# Patient Record
Sex: Male | Born: 1945 | Race: White | Hispanic: No | Marital: Single | Smoking: Former smoker
Health system: Southern US, Community
[De-identification: ages and names within clinical notes are randomized; demographics above are authoritative.]

## PROBLEM LIST (undated history)

## (undated) DIAGNOSIS — I2510 Atherosclerotic heart disease of native coronary artery without angina pectoris: Secondary | ICD-10-CM

## (undated) DIAGNOSIS — I10 Essential (primary) hypertension: Secondary | ICD-10-CM

## (undated) DIAGNOSIS — I219 Acute myocardial infarction, unspecified: Secondary | ICD-10-CM

## (undated) DIAGNOSIS — E785 Hyperlipidemia, unspecified: Secondary | ICD-10-CM

## (undated) DIAGNOSIS — I509 Heart failure, unspecified: Secondary | ICD-10-CM

## (undated) DIAGNOSIS — I639 Cerebral infarction, unspecified: Secondary | ICD-10-CM

## (undated) HISTORY — PX: CORONARY ARTERY BYPASS GRAFT: SHX141

## (undated) HISTORY — PX: THUMB FUSION: SUR636

## (undated) HISTORY — PX: CARDIAC CATHETERIZATION: SHX172

---

## 2003-12-03 ENCOUNTER — Other Ambulatory Visit: Payer: Self-pay

## 2003-12-05 ENCOUNTER — Other Ambulatory Visit: Payer: Self-pay

## 2004-11-13 ENCOUNTER — Emergency Department: Payer: Self-pay | Admitting: Emergency Medicine

## 2005-02-14 ENCOUNTER — Inpatient Hospital Stay: Payer: Self-pay

## 2005-02-14 ENCOUNTER — Other Ambulatory Visit: Payer: Self-pay

## 2005-06-25 ENCOUNTER — Inpatient Hospital Stay: Payer: Self-pay | Admitting: Internal Medicine

## 2005-06-25 ENCOUNTER — Other Ambulatory Visit: Payer: Self-pay

## 2005-12-04 ENCOUNTER — Inpatient Hospital Stay: Payer: Self-pay | Admitting: Internal Medicine

## 2005-12-04 ENCOUNTER — Other Ambulatory Visit: Payer: Self-pay

## 2005-12-05 ENCOUNTER — Other Ambulatory Visit: Payer: Self-pay

## 2005-12-06 ENCOUNTER — Other Ambulatory Visit: Payer: Self-pay

## 2010-12-05 ENCOUNTER — Inpatient Hospital Stay: Payer: Self-pay | Admitting: Internal Medicine

## 2012-02-18 ENCOUNTER — Emergency Department: Payer: Self-pay | Admitting: Emergency Medicine

## 2012-02-24 ENCOUNTER — Emergency Department: Payer: Self-pay | Admitting: Emergency Medicine

## 2014-01-04 ENCOUNTER — Inpatient Hospital Stay: Payer: Self-pay | Admitting: Internal Medicine

## 2014-01-04 LAB — CBC
HCT: 39.8 % — ABNORMAL LOW (ref 40.0–52.0)
HGB: 13.5 g/dL (ref 13.0–18.0)
MCH: 28.7 pg (ref 26.0–34.0)
MCHC: 33.9 g/dL (ref 32.0–36.0)
MCV: 85 fL (ref 80–100)
PLATELETS: 149 10*3/uL — AB (ref 150–440)
RBC: 4.7 10*6/uL (ref 4.40–5.90)
RDW: 14 % (ref 11.5–14.5)
WBC: 6.8 10*3/uL (ref 3.8–10.6)

## 2014-01-04 LAB — BASIC METABOLIC PANEL
Anion Gap: 7 (ref 7–16)
BUN: 17 mg/dL (ref 7–18)
CALCIUM: 9.1 mg/dL (ref 8.5–10.1)
CHLORIDE: 111 mmol/L — AB (ref 98–107)
CREATININE: 1.09 mg/dL (ref 0.60–1.30)
Co2: 23 mmol/L (ref 21–32)
EGFR (African American): >60
EGFR (Non-African Amer.): >60
GLUCOSE: 154 mg/dL — AB (ref 65–99)
Osmolality: 286 (ref 275–301)
Potassium: 4 mmol/L (ref 3.5–5.1)
Sodium: 141 mmol/L (ref 136–145)

## 2014-01-04 LAB — PRO B NATRIURETIC PEPTIDE: B-TYPE NATIURETIC PEPTID: 6898 pg/mL — AB (ref 0–125)

## 2014-01-04 LAB — PROTIME-INR
INR: 1.1
Prothrombin Time: 14.5 secs (ref 11.5–14.7)

## 2014-01-04 LAB — CK TOTAL AND CKMB (NOT AT ARMC)
CK, Total: 67 U/L
CK-MB: 1.3 ng/mL (ref 0.5–3.6)

## 2014-01-04 LAB — HEMOGLOBIN A1C: HEMOGLOBIN A1C: 6 % (ref 4.2–6.3)

## 2014-01-04 LAB — APTT: Activated PTT: 28.8 secs (ref 23.6–35.9)

## 2014-01-04 LAB — TROPONIN I: Troponin-I: 0.17 ng/mL — ABNORMAL HIGH

## 2014-01-05 LAB — BASIC METABOLIC PANEL
Anion Gap: 6 — ABNORMAL LOW (ref 7–16)
BUN: 18 mg/dL (ref 7–18)
CALCIUM: 9.1 mg/dL (ref 8.5–10.1)
Chloride: 109 mmol/L — ABNORMAL HIGH (ref 98–107)
Co2: 27 mmol/L (ref 21–32)
Creatinine: 1.28 mg/dL (ref 0.60–1.30)
EGFR (African American): >60
EGFR (Non-African Amer.): 58 — ABNORMAL LOW
Glucose: 108 mg/dL — ABNORMAL HIGH (ref 65–99)
Osmolality: 286 (ref 275–301)
Potassium: 4.5 mmol/L (ref 3.5–5.1)
SODIUM: 142 mmol/L (ref 136–145)

## 2014-01-05 LAB — HEMOGLOBIN A1C: Hemoglobin A1C: 6.1 % (ref 4.2–6.3)

## 2014-01-05 LAB — LIPID PANEL
Cholesterol: 177 mg/dL (ref 0–200)
HDL: 29 mg/dL — AB (ref 40–60)
Ldl Cholesterol, Calc: 129 mg/dL — ABNORMAL HIGH (ref 0–100)
Triglycerides: 93 mg/dL (ref 0–200)
VLDL CHOLESTEROL, CALC: 19 mg/dL (ref 5–40)

## 2014-01-05 LAB — TSH: Thyroid Stimulating Horm: 3.29 u[IU]/mL

## 2014-01-05 LAB — MAGNESIUM: Magnesium: 2.1 mg/dL

## 2014-01-05 LAB — TROPONIN I
TROPONIN-I: 0.2 ng/mL — AB
Troponin-I: 0.16 ng/mL — ABNORMAL HIGH

## 2014-01-06 LAB — BASIC METABOLIC PANEL
ANION GAP: 4 — AB (ref 7–16)
BUN: 26 mg/dL — ABNORMAL HIGH (ref 7–18)
Calcium, Total: 9.1 mg/dL (ref 8.5–10.1)
Chloride: 104 mmol/L (ref 98–107)
Co2: 30 mmol/L (ref 21–32)
Creatinine: 1.5 mg/dL — ABNORMAL HIGH (ref 0.60–1.30)
EGFR (African American): 55 — ABNORMAL LOW
EGFR (Non-African Amer.): 47 — ABNORMAL LOW
GLUCOSE: 99 mg/dL (ref 65–99)
OSMOLALITY: 280 (ref 275–301)
Potassium: 4.6 mmol/L (ref 3.5–5.1)
Sodium: 138 mmol/L (ref 136–145)

## 2014-01-06 LAB — MAGNESIUM: Magnesium: 2.1 mg/dL

## 2014-01-07 LAB — BASIC METABOLIC PANEL
ANION GAP: 4 — AB (ref 7–16)
BUN: 28 mg/dL — ABNORMAL HIGH (ref 7–18)
Calcium, Total: 9.1 mg/dL (ref 8.5–10.1)
Chloride: 104 mmol/L (ref 98–107)
Co2: 28 mmol/L (ref 21–32)
Creatinine: 1.26 mg/dL (ref 0.60–1.30)
EGFR (African American): >60
EGFR (Non-African Amer.): 59 — ABNORMAL LOW
GLUCOSE: 107 mg/dL — AB (ref 65–99)
Osmolality: 278 (ref 275–301)
Potassium: 4.7 mmol/L (ref 3.5–5.1)
SODIUM: 136 mmol/L (ref 136–145)

## 2014-01-22 LAB — CBC
HCT: 37 % — AB (ref 40.0–52.0)
HGB: 12.1 g/dL — ABNORMAL LOW (ref 13.0–18.0)
MCH: 28.1 pg (ref 26.0–34.0)
MCHC: 32.6 g/dL (ref 32.0–36.0)
MCV: 86 fL (ref 80–100)
Platelet: 158 10*3/uL (ref 150–440)
RBC: 4.3 10*6/uL — ABNORMAL LOW (ref 4.40–5.90)
RDW: 13.9 % (ref 11.5–14.5)
WBC: 6.3 10*3/uL (ref 3.8–10.6)

## 2014-01-22 LAB — COMPREHENSIVE METABOLIC PANEL
ALBUMIN: 3.4 g/dL (ref 3.4–5.0)
ALK PHOS: 132 U/L — AB
ALT: 20 U/L (ref 12–78)
ANION GAP: 6 — AB (ref 7–16)
BILIRUBIN TOTAL: 0.6 mg/dL (ref 0.2–1.0)
BUN: 16 mg/dL (ref 7–18)
Calcium, Total: 8.6 mg/dL (ref 8.5–10.1)
Chloride: 110 mmol/L — ABNORMAL HIGH (ref 98–107)
Co2: 27 mmol/L (ref 21–32)
Creatinine: 1.26 mg/dL (ref 0.60–1.30)
EGFR (African American): >60
EGFR (Non-African Amer.): 59 — ABNORMAL LOW
GLUCOSE: 175 mg/dL — AB (ref 65–99)
OSMOLALITY: 290 (ref 275–301)
Potassium: 3.9 mmol/L (ref 3.5–5.1)
SGOT(AST): 25 U/L (ref 15–37)
Sodium: 143 mmol/L (ref 136–145)
Total Protein: 7.3 g/dL (ref 6.4–8.2)

## 2014-01-22 LAB — URINALYSIS, COMPLETE
BILIRUBIN, UR: NEGATIVE
Bacteria: NONE SEEN
Blood: NEGATIVE
Glucose,UR: NEGATIVE mg/dL (ref 0–75)
Ketone: NEGATIVE
Nitrite: NEGATIVE
PH: 5 (ref 4.5–8.0)
Protein: 30
RBC,UR: NONE SEEN /HPF (ref 0–5)
SPECIFIC GRAVITY: 1.028 (ref 1.003–1.030)
Squamous Epithelial: NONE SEEN

## 2014-01-22 LAB — PRO B NATRIURETIC PEPTIDE: B-TYPE NATIURETIC PEPTID: 5488 pg/mL — AB (ref 0–125)

## 2014-01-22 LAB — CK TOTAL AND CKMB (NOT AT ARMC)
CK, Total: 50 U/L
CK-MB: 1.3 ng/mL (ref 0.5–3.6)

## 2014-01-22 LAB — TROPONIN I: TROPONIN-I: 0.05 ng/mL

## 2014-01-23 ENCOUNTER — Inpatient Hospital Stay: Payer: Self-pay | Admitting: Specialist

## 2014-01-23 DIAGNOSIS — R079 Chest pain, unspecified: Secondary | ICD-10-CM

## 2014-01-23 LAB — CBC WITH DIFFERENTIAL/PLATELET
BASOS ABS: 0 10*3/uL (ref 0.0–0.1)
Basophil %: 0.5 %
EOS ABS: 0.1 10*3/uL (ref 0.0–0.7)
EOS PCT: 1.2 %
HCT: 39 % — ABNORMAL LOW (ref 40.0–52.0)
HGB: 12.5 g/dL — ABNORMAL LOW (ref 13.0–18.0)
Lymphocyte #: 1 10*3/uL (ref 1.0–3.6)
Lymphocyte %: 14 %
MCH: 27.7 pg (ref 26.0–34.0)
MCHC: 32.1 g/dL (ref 32.0–36.0)
MCV: 86 fL (ref 80–100)
MONO ABS: 0.6 x10 3/mm (ref 0.2–1.0)
MONOS PCT: 8.2 %
Neutrophil #: 5.4 10*3/uL (ref 1.4–6.5)
Neutrophil %: 76.1 %
PLATELETS: 171 10*3/uL (ref 150–440)
RBC: 4.53 10*6/uL (ref 4.40–5.90)
RDW: 14.1 % (ref 11.5–14.5)
WBC: 7 10*3/uL (ref 3.8–10.6)

## 2014-01-23 LAB — LIPID PANEL
CHOLESTEROL: 125 mg/dL (ref 0–200)
HDL: 31 mg/dL — AB (ref 40–60)
LDL CHOLESTEROL, CALC: 80 mg/dL (ref 0–100)
TRIGLYCERIDES: 72 mg/dL (ref 0–200)
VLDL Cholesterol, Calc: 14 mg/dL (ref 5–40)

## 2014-01-23 LAB — APTT
ACTIVATED PTT: 66.2 s — AB (ref 23.6–35.9)
Activated PTT: 33.9 secs (ref 23.6–35.9)

## 2014-01-23 LAB — TROPONIN I
TROPONIN-I: 1.2 ng/mL — AB
Troponin-I: 3.6 ng/mL — ABNORMAL HIGH

## 2014-01-23 LAB — CK-MB
CK-MB: 10.8 ng/mL — AB (ref 0.5–3.6)
CK-MB: 5.7 ng/mL — AB (ref 0.5–3.6)
CK-MB: 9.1 ng/mL — AB (ref 0.5–3.6)

## 2014-01-24 LAB — BASIC METABOLIC PANEL
ANION GAP: 4 — AB (ref 7–16)
BUN: 13 mg/dL (ref 7–18)
Calcium, Total: 9.1 mg/dL (ref 8.5–10.1)
Chloride: 102 mmol/L (ref 98–107)
Co2: 33 mmol/L — ABNORMAL HIGH (ref 21–32)
Creatinine: 1.14 mg/dL (ref 0.60–1.30)
EGFR (Non-African Amer.): >60
GLUCOSE: 106 mg/dL — AB (ref 65–99)
Osmolality: 278 (ref 275–301)
Potassium: 3.6 mmol/L (ref 3.5–5.1)
Sodium: 139 mmol/L (ref 136–145)

## 2014-01-24 LAB — APTT: ACTIVATED PTT: 74.3 s — AB (ref 23.6–35.9)

## 2014-01-28 LAB — CULTURE, BLOOD (SINGLE)

## 2014-04-11 ENCOUNTER — Inpatient Hospital Stay: Payer: Self-pay | Admitting: Internal Medicine

## 2014-04-11 LAB — BASIC METABOLIC PANEL
ANION GAP: 6 — AB (ref 7–16)
BUN: 21 mg/dL — ABNORMAL HIGH (ref 7–18)
Calcium, Total: 8.5 mg/dL (ref 8.5–10.1)
Chloride: 109 mmol/L — ABNORMAL HIGH (ref 98–107)
Co2: 29 mmol/L (ref 21–32)
Creatinine: 1.17 mg/dL (ref 0.60–1.30)
EGFR (African American): >60
GLUCOSE: 137 mg/dL — AB (ref 65–99)
Osmolality: 292 (ref 275–301)
Potassium: 4.3 mmol/L (ref 3.5–5.1)
SODIUM: 144 mmol/L (ref 136–145)

## 2014-04-11 LAB — CBC
HCT: 39.6 % — AB (ref 40.0–52.0)
HGB: 12.7 g/dL — AB (ref 13.0–18.0)
MCH: 27.7 pg (ref 26.0–34.0)
MCHC: 32.1 g/dL (ref 32.0–36.0)
MCV: 86 fL (ref 80–100)
PLATELETS: 147 10*3/uL — AB (ref 150–440)
RBC: 4.58 10*6/uL (ref 4.40–5.90)
RDW: 15.7 % — ABNORMAL HIGH (ref 11.5–14.5)
WBC: 7.1 10*3/uL (ref 3.8–10.6)

## 2014-04-11 LAB — CK-MB
CK-MB: 1.2 ng/mL (ref 0.5–3.6)
CK-MB: 1.2 ng/mL (ref 0.5–3.6)
CK-MB: 1.1 ng/mL (ref 0.5–3.6)

## 2014-04-11 LAB — TROPONIN I
TROPONIN-I: 0.08 ng/mL — AB
Troponin-I: 0.08 ng/mL — ABNORMAL HIGH
Troponin-I: 0.08 ng/mL — ABNORMAL HIGH

## 2014-04-11 LAB — PRO B NATRIURETIC PEPTIDE: B-TYPE NATIURETIC PEPTID: 11464 pg/mL — AB (ref 0–125)

## 2014-04-11 LAB — MAGNESIUM: Magnesium: 2.1 mg/dL

## 2014-04-12 LAB — COMPREHENSIVE METABOLIC PANEL
ALK PHOS: 122 U/L — AB
ALT: 14 U/L
Albumin: 3.4 g/dL (ref 3.4–5.0)
Anion Gap: 10 (ref 7–16)
BILIRUBIN TOTAL: 1.7 mg/dL — AB (ref 0.2–1.0)
BUN: 21 mg/dL — ABNORMAL HIGH (ref 7–18)
CO2: 29 mmol/L (ref 21–32)
CREATININE: 1.16 mg/dL (ref 0.60–1.30)
Calcium, Total: 8.6 mg/dL (ref 8.5–10.1)
Chloride: 102 mmol/L (ref 98–107)
EGFR (Non-African Amer.): >60
Glucose: 124 mg/dL — ABNORMAL HIGH (ref 65–99)
OSMOLALITY: 286 (ref 275–301)
POTASSIUM: 3.9 mmol/L (ref 3.5–5.1)
SGOT(AST): 25 U/L (ref 15–37)
Sodium: 141 mmol/L (ref 136–145)
Total Protein: 7.7 g/dL (ref 6.4–8.2)

## 2014-04-12 LAB — CBC WITH DIFFERENTIAL/PLATELET
Basophil #: 0 x10 3/mm 3
Basophil %: 0.4 %
Eosinophil #: 0.1 x10 3/mm 3
Eosinophil %: 1.4 %
HCT: 40 %
HGB: 13 g/dL
Lymphocyte %: 19.1 %
Lymphs Abs: 1.5 x10 3/mm 3
MCH: 27.8 pg
MCHC: 32.4 g/dL
MCV: 86 fL
Monocyte #: 0.7 "x10 3/mm "
Monocyte %: 9.3 %
Neutrophil #: 5.5 x10 3/mm 3
Neutrophil %: 69.8 %
Platelet: 143 x10 3/mm 3 — ABNORMAL LOW
RBC: 4.67 x10 6/mm 3
RDW: 15.2 % — ABNORMAL HIGH
WBC: 7.9 x10 3/mm 3

## 2014-04-12 LAB — PROTIME-INR
INR: 1.3
Prothrombin Time: 15.6 secs — ABNORMAL HIGH (ref 11.5–14.7)

## 2014-12-22 NOTE — Discharge Summary (Signed)
Dates of Admission and Diagnosis:  Date of Admission 11-Apr-2014   Date of Discharge 13-Apr-2014   Admitting Diagnosis CHF   Final Diagnosis Ac systolic CHF Htn Elevated troponin- demnad ischemia due to CHF.    Chief Complaint/History of Present Illness HISTORY OF PRESENT ILLNESS: This is a 69 year old male with a known history of congestive heart failure, systolic ejection fraction 25%, hypertension, coronary artery disease status post CABG. The patient presents with complaints of worsening shortness of breath and worsening lower extremity edema and scrotal edema. The patient reports his symptoms have been progressively worsening over the last few weeks. The patient was recently admitted in May of this year for the same complaints, which improved after diuresis. The patient reports he has been compliant with his medication. As well, he tries to be compliant with his diet. He adds no salt to his food whenever he cooks, but when he is not, which is most of the time, he eats pre-made food. As well, he reports worsening lower extremity edema and having some scrotal edema as well worsening shortness of breath, mainly orthopnea. He sleeps on 3 pillows at baseline, reports this has been for a few months. He reports he is spending most of the time sitting, as well. As well, he reports dyspnea on exertion. Denies any chest pain or chest discomfort.   The patient's EKG does not have any significant changes from previous, where it does show right bundle branch block and left posterior fascicular block. The patient's chest x-ray does show evidence of volume overload. The patient's troponin was found to be elevated at 0.08, but by reviewing records, the patient appears to be having chronically elevated troponin. At one point, during his most recent admission, it was up to 3.6 which was deemed secondary to demand ischemia. As well in the ED, the patient was noticed to have a few runs of VTach, where he had 11 runs  of sustained VTach. The patient denied any complaints during that run.   Allergies:  No Known Allergies:   Hepatic:  13-Aug-15 04:52   Bilirubin, Total  1.7  Alkaline Phosphatase  122 (46-116 NOTE: New Reference Range 03/20/14)  SGPT (ALT) 14 (14-63 NOTE: New Reference Range 03/20/14)  SGOT (AST) 25  Total Protein, Serum 7.7  Albumin, Serum 3.4  Routine Chem:  12-Aug-15 04:26   Glucose, Serum  137  BUN  21  Creatinine (comp) 1.17  Sodium, Serum 144  Potassium, Serum 4.3  Chloride, Serum  109  CO2, Serum 29  Calcium (Total), Serum 8.5  Osmolality (calc) 292  eGFR (African American) >60  eGFR (Non-African American) >60 (eGFR values <22m/min/1.73 m2 may be an indication of chronic kidney disease (CKD). Calculated eGFR is useful in patients with stable renal function. The eGFR calculation will not be reliable in acutely ill patients when serum creatinine is changing rapidly. It is not useful in  patients on dialysis. The eGFR calculation may not be applicable to patients at the low and high extremes of body sizes, pregnant women, and vegetarians.)  Anion Gap  6  Result Comment TROPONIN - RESULTS VERIFIED BY REPEAT TESTING.  - CALLED TO ALICIA RAWLINS AT 028368/12/15  - BY [facility].  - READ-BACK PROCESS PERFORMED.  Result(s) reported on 11 Apr 2014 at 05:23AM.  Magnesium, Serum 2.1 (1.8-2.4 THERAPEUTIC RANGE: 4-7 mg/dL TOXIC: > 10 mg/dL  -----------------------)  B-Type Natriuretic Peptide ([facility]  [id number](Result(s) reported on 11 Apr 2014 at 05:18AM.)  13-Aug-15 04:52   Glucose, Serum  124  BUN  21  Creatinine (comp) 1.16  Sodium, Serum 141  Potassium, Serum 3.9  Chloride, Serum 102  CO2, Serum 29  Calcium (Total), Serum 8.6  Osmolality (calc) 286  eGFR (African American) >60  eGFR (Non-African American) >60 (eGFR values <9m/min/1.73 m2 may be an indication of chronic kidney disease (CKD). Calculated eGFR is useful in patients with stable renal function. The eGFR  calculation will not be reliable in acutely ill patients when serum creatinine is changing rapidly. It is not useful in  patients on dialysis. The eGFR calculation may not be applicable to patients at the low and high extremes of body sizes, pregnant women, and vegetarians.)  Anion Gap 10  Cardiac:  12-Aug-15 04:26   CPK-MB, Serum 1.2 (Result(s) reported on 11 Apr 2014 at 0[facility])  Troponin I  0.08 (0.00-0.05 0.05 ng/mL or less: NEGATIVE  Repeat testing in 3-6 hrs  if clinically indicated. >0.05 ng/mL: POTENTIAL  MYOCARDIAL INJURY. Repeat  testing in 3-6 hrs if  clinically indicated. NOTE: An increase or decrease  of 30% or more on serial  testing suggests a  clinically important change)    08:39   Troponin I  0.08 (0.00-0.05 0.05 ng/mL or less: NEGATIVE  Repeat testing in 3-6 hrs  if clinically indicated. >0.05 ng/mL: POTENTIAL  MYOCARDIAL INJURY. Repeat  testing in 3-6 hrs if  clinically indicated. NOTE: An increase or decrease  of 30% or more on serial  testing suggests a  clinically important change)    12:40   Troponin I  0.08 (0.00-0.05 0.05 ng/mL or less: NEGATIVE  Repeat testing in 3-6 hrs  if clinically indicated. >0.05 ng/mL: POTENTIAL  MYOCARDIAL INJURY. Repeat  testing in 3-6 hrs if  clinically indicated. NOTE: An increase or decrease  of 30% or more on serial  testing suggests a  clinically important change)  Routine Coag:  13-Aug-15 04:52   Prothrombin  15.6  INR 1.3 (INRreference interval applies to patients on anticoagulant therapy. A single INR therapeutic range for coumarins is not optimal for all indications; however, the suggested range for most indications is 2.0 - 3.0. Exceptions to the INR Reference Range may include: Prosthetic heart valves, acute myocardial infarction, prevention of myocardial infarction, and combinations of aspirin and anticoagulant. The need for a higher or lower target INR must be assessed individually. Reference:  The Pharmacology and Management of the Vitamin K  antagonists: the seventh ACCP Conference on Antithrombotic and Thrombolytic Therapy. CFXJOI.3254Sept:126 (3suppl): [id number] A HCT value >55% may artifactually increase the PT.  In one study,  the increase was an average of 25%. Reference:  "Effect on Routine and Special Coagulation Testing Values of Citrate Anticoagulant Adjustment in Patients with High HCT Values." American Journal of Clinical Pathology 2006;126:400-405.)  Routine Hem:  12-Aug-15 04:26   WBC (CBC) 7.1  RBC (CBC) 4.58  Hemoglobin (CBC)  12.7  Hematocrit (CBC)  39.6  Platelet Count (CBC)  147 (Result(s) reported on 11 Apr 2014 at 04:59AM.)  MCV 86  MCH 27.7  MCHC 32.1  RDW  15.7  13-Aug-15 04:52   WBC (CBC) 7.9  RBC (CBC) 4.67  Hemoglobin (CBC) 13.0  Hematocrit (CBC) 40.0  Platelet Count (CBC)  143  MCV 86  MCH 27.8  MCHC 32.4  RDW  15.2  Neutrophil % 69.8  Lymphocyte % 19.1  Monocyte % 9.3  Eosinophil % 1.4  Basophil % 0.4  Neutrophil # 5.5  Lymphocyte # 1.5  Monocyte # 0.7  Eosinophil #  0.1  Basophil # 0.0 (Result(s) reported on 12 Apr 2014 at 05:45AM.)   PERTINENT RADIOLOGY STUDIES: XRay:    12-Aug-15 05:20, Chest PA and Lateral  Chest PA and Lateral   REASON FOR EXAM:    SOB  COMMENTS:       PROCEDURE: DXR - DXR CHEST PA (OR AP) AND LATERAL  - Apr 11 2014  5:20AM     CLINICAL DATA:  Shortness of breath, CHF.    EXAM:  CHEST  2 VIEW    COMPARISON:  Chest radiograph Jan 22, 2014    FINDINGS:  The cardiac silhouette appears moderate to severely enlarged,  unchanged. Status post median sternotomy for apparent Coronary  artery bypass grafting. Similar central pulmonary vascular  congestion. Retrocardiac consolidation with possible small left  pleural effusion. Strandy densities in left lung base. No  pneumothorax.    Soft tissue planes and included osseous structures are  nonsuspicious.     IMPRESSION:  Stable cardiomegaly, pulmonary  vascular congestion with retrocardiac  consolidation which could reflect atelectasis and/or confluent  edema. Possible small left pleural effusion.      Electronically Signed    By: Elon Alas    On: 04/11/2014 05:51         Verified By: Ricky Ala, M.D.,  LabUnknown:  PACS Image    Pertinent Past History:  Pertinent Past History Hypertension, coronary artery disease status post CABG, congestive heart failure systolic.   Hospital Course:  Hospital Course 1.  Systolic congestive heart failure.    IV lasix BID, appreciated Cardiology consult.   beta blockers, Imdur, add Aldactone, ejection fraction less than 30%.     much better today.    ambulating fine now- d/c home- follow in [facility].  2.  Elevated troponins.  no chest pain, and has no EKG changes. likely demand ischemia from his congestive heart failure.   3.  Hypertension. Blood pressure is mildly elevated.   continue him on beta blockers, lisinopril and Imdur, and added hydralazine and Aldactone. increased dose of metoprolol.  4.  A few runs of nonsustained ventricular tachycardia.    magnesium, 2 mg of IV magnesium sulfate. on telemetry monitor.   Condition on Discharge Stable   Code Status:  Code Status Full Code   DISCHARGE INSTRUCTIONS HOME MEDS:  Medication Reconciliation: Patient's Home Medications at Discharge:     Medication Instructions  nitroglycerin 0.4 mg sublingual tablet  1 tab(s) sublingual , As needed, chest pain   spironolactone 25 mg oral tablet  1 tab(s) orally once a day   lisinopril 40 mg oral tablet  1 tab(s) orally once a day   isosorbide mononitrate 60 mg oral tablet, extended release  1 tab(s) orally once a day   pravastatin 40 mg oral tablet  1 tab(s) orally once a day (at bedtime)   metoprolol tartrate 25 mg oral tablet  1 tab(s) orally 2 times a day   furosemide 20 mg oral tablet  1 tab(s) orally 2 times a day     Physician's Instructions:  Diet Low Sodium   Low Fat, Low Cholesterol   Activity Limitations As tolerated   Return to Work Not Applicable   Time frame for Follow Up Appointment 1-2 weeks  Dr.Kowalski   Time frame for Follow Up Appointment [facility] Friday, April 27, 2014 at 9:00am. Please call [phone] to reschedule.     Serafina Royals J(Consultant): [facility], [address] [place], [place] [address], [place]  Electronic Signatures: Vaughan Basta (MD)  (Signed [phone] 21:05)  Authored: ADMISSION DATE AND DIAGNOSIS, CHIEF COMPLAINT/HPI, Allergies, PERTINENT LABS, PERTINENT RADIOLOGY STUDIES, PERTINENT PAST HISTORY, HOSPITAL COURSE, DISCHARGE INSTRUCTIONS HOME MEDS, PATIENT INSTRUCTIONS, Follow Up Physician   Last Updated: 18-Aug-15 21:05 by Vaughan Basta (MD)

## 2014-12-22 NOTE — H&P (Signed)
PATIENT NAME:  Dillon [name], ELMAN MR#:  [id number] DATE OF BIRTH:  09/05/1945  DATE OF ADMISSION:  04/11/2014  REFERRING PHYSICIAN:  Loney Hering, MD  PRIMARY CARE PHYSICIAN: Recently started seeing Scot Jun. Jenne Campus, PA-C.  CARDIOLOGIST: Corey Skains, MD  CHIEF COMPLAINT: Shortness of breath. Worsening lower extremity edema.   HISTORY OF PRESENT ILLNESS: This is a 69 year old male with a known history of congestive heart failure, systolic ejection fraction 25%, hypertension, coronary artery disease status post CABG. The patient presents with complaints of worsening shortness of breath and worsening lower extremity edema and scrotal edema. The patient reports his symptoms have been progressively worsening over the last few weeks. The patient was recently admitted in May of this year for the same complaints, which improved after diuresis. The patient reports he has been compliant with his medication. As well, he tries to be compliant with his diet. He adds no salt to his food whenever he cooks, but when he is not, which is most of the time, he eats pre-made food. As well, he reports worsening lower extremity edema and having some scrotal edema as well worsening shortness of breath, mainly orthopnea. He sleeps on 3 pillows at baseline, reports this has been for a few months. He reports he is spending most of the time sitting, as well. As well, he reports dyspnea on exertion. Denies any chest pain or chest discomfort.   The patient's EKG does not have any significant changes from previous, where it does show right bundle branch block and left posterior fascicular block. The patient's chest x-ray does show evidence of volume overload. The patient's troponin was found to be elevated at 0.08, but by reviewing records, the patient appears to be having chronically elevated troponin. At one point, during his most recent admission, it was up to 3.6 which was deemed secondary to demand ischemia. As well in  the ED, the patient was noticed to have a few runs of VTach, where he had 11 runs of sustained VTach. The patient denied any complaints during that run.   REVIEW OF SYSTEMS:  CONSTITUTIONAL: Denies fever, chills. Reports fatigue, weakness. Reports weight gain.  EYES: Denies blurry vision, double vision or inflammation.  EARS, NOSE, THROAT: Denies tinnitus, ear pain, hearing loss, or epistaxis.  RESPIRATORY: Denies cough, wheezing. Reports dyspnea on exertion and orthopnea. Denies COPD.  CARDIOVASCULAR: Denies any chest pain. Reports orthopnea and worsening edema. No syncope. No palpitations.  GASTROINTESTINAL: Denies nausea, vomiting, diarrhea, abdominal pain, or hematemesis.  GENITOURINARY: Denies dysuria, hematuria, or renal colic.  ENDOCRINE: Denies polyuria, polydipsia, heat or cold intolerance.  HEMATOLOGY: Denies anemia, easy bruising, bleeding, diathesis. INTEGUMENT: Denies acne, rash, or skin lesion.  MUSCULOSKELETAL: Denies any arthritis, neck pain, back pain, cramps.  NEUROLOGIC: Denies any history of CVA, TIA, focal deficits, tingling, numbness, headache. Reports some lightheadedness.  PSYCHIATRIC: Denies anxiety, insomnia or depression.   PAST MEDICAL HISTORY: Hypertension, coronary artery disease status post CABG, congestive heart failure systolic.   PAST SOCIAL HISTORY: Tobacco use, alcohol use in the past. No history of drug use.   FAMILY HISTORY: Significant for: Hypertension.   ALLERGIES: No known drug allergies.   HOME MEDICATIONS:  1.  Lisinopril 40 mg oral daily.  2.  Imdur 60 mg oral daily.  3.  Sublingual nitroglycerin as needed.  4.  Pravastatin 40 mg oral at bedtime.  5.  Metoprolol succinate 25 mg extended release oral daily.  6.  Lasix 20 mg oral daily.    PHYSICAL  EXAMINATION:  VITAL SIGNS:  Temperature 97.9, pulse 73, respiratory rate 22, blood pressure 178/91, saturating 99% on oxygen.  GENERAL: Morbidly obese male, lays comfortably in bed, in no  apparent distress.  HEENT: Head atraumatic, normocephalic. Pupils equal and reactive to light. Pink conjunctivae. Anicteric sclerae. Moist oral mucosa. No nasal discharge or bleed.  NECK: Supple. No thyromegaly. Has thickening neck; could not appreciate any JVD, but has hepatojugular reflux. No carotid bruits.  CHEST: Had good air entry bilaterally. No wheezing, rales, rhonchi, or any use of accessory muscles.  CARDIOVASCULAR: S1, S2 heard. No rubs, murmur, or gallops. PMI displaced laterally.  ABDOMEN: Obese, soft, nontender, nondistended. Bowel sounds present.  EXTREMITIES: There is +3 edema bilaterally, up to the scrotum. Pedal pulses felt bilaterally.  GENITOURINARY: He has scrotal edema.  PSYCHIATRIC: Appropriate affect. Awake, alert x 3. Intact judgment and insight.  NEUROLOGIC: Cranial nerves II-XII grossly intact. Motor 5/5. No focal deficits.  MUSCULOSKELETAL: No joint effusion or erythema.  LYMPHATICS: No cervical lymphadenopathy could be appreciated.   PERTINENT LABORATORIES: Glucose 137. BNP 11,464. BUN 21, creatinine 1.17, sodium 144, potassium 4.3, chloride 109, CO2 of 29. Troponin 0.08. White blood cells 7.1, hemoglobin 12.7, hematocrit 39.6, platelets 147,000.   Chest x-ray showing stable cardiomegaly, pulmonary vascular congestion with retrocardiac consolidation which could reflect atelectasis and/or confluent edema; possible small left pleural effusion.   EKG showing normal sinus rhythm at 66 beats per minute, with bifascicular block which is old.   ASSESSMENT AND PLAN:  1.  Systolic congestive heart failure. The patient presents with acute exacerbation, evident by his worsening edema and shortness of breath and orthopnea. He will be started on IV Lasix and kept on fluid restrictions with strict ins and outs, daily weights. Will consult cardiology service. The patient's primary cardiologist is Dr. Nehemiah Massed. As well, we will continue him on his beta blockers, Imdur. Will add  Aldactone given the fact he is known to have ejection fraction less than 30%.  2.  Elevated troponins. The patient denies any chest pain, and has no EKG changes. This is most likely demand ischemia from his congestive heart failure. Actually, the levels are trending down. We will give him 1 dose of aspirin. We will continue to cycle his cardiac enzymes and follow the trend.  3.  Hypertension. Blood pressure is mildly elevated. We will continue him on beta blockers and lisinopril and Imdur, and will add hydralazine and Aldactone.  4.  A few runs of nonsustained ventricular tachycardia. We will give the patient magnesium, 2 mg of IV magnesium sulfate. We will check magnesium level. We will keep on telemetry monitor. We will consult cardiology.  5.  Deep vein thrombosis prophylaxis. Subcutaneous heparin.   CODE STATUS: The patient is Full Code.   TOTAL TIME SPENT ON ADMISSION AND PATIENT CARE: 55 minutes.     ____________________________ Albertine Patricia, MD dse:MT D: 04/11/2014 06:46:07 ET T: 04/11/2014 07:08:59 ET JOB#: [id number]  cc: Albertine Patricia, MD, <Dictator> Kelena Garrow Graciela Husbands MD ELECTRONICALLY SIGNED 04/13/2014 13:16

## 2014-12-22 NOTE — H&P (Signed)
PATIENT NAME:  Dillon [name], Dillon [name] MR#:  [id number] DATE OF BIRTH:  1945/11/18  DATE OF ADMISSION:  01/04/2014  PRIMARY CARE PHYSICIAN: None.   CHIEF COMPLAINT: Chest pain, shortness of breath and lower extremity edema.   HISTORY OF PRESENT ILLNESS:  A 69 year old male with a history of CAD, CABG and  stents who is noncompliant any medications except for aspirin, who presents with the above complaint. Over the past week the patient has had shortness of breath, chest pain, PND, orthopnea, and increasing lower extremity edema. He presented today because he was worried.  In the ER, he was noted to have an elevated BNP and chest x-ray consistent with pulmonary edema as well as elevation in his troponin.   REVIEW OF SYSTEMS: CONSTITUTIONAL: No fever, no chills. Positive fatigue, weakness, positive weight gain.  EYES: No blurred or double vision ENT: No ear pain or hearing loss. Seasonal allergies. Positive snoring . No cough, wheezing, hemoptysis, COPD or dyspnea.  CARDIOVASCULAR: Positive chest pain. Positive orthopnea. Positive PND. Positive  GASTROINTESTINAL: Positive nausea. No vomiting, diarrhea, abdominal pain, melena or ulcers. GENITOURINARY:  No dysuria or hematuria. ENDOCRINE:  Positive polyuria. No polydipsia.  HEME AND LYMPH: Positive easy bruising. SKIN: No rash or lesions. MUSCULOSKELETAL: Positive limited activity due to body habitus. PSYCH: No history of anxiety or depression.   PAST MEDICAL HISTORY: 1. CAD status post CABG and stents.  2. Hypertension.  3. Noncompliance.   MEDICATIONS: He only takes aspirin 81 mg daily.   ALLERGIES: No known drug allergies.   PAST SURGICAL HISTORY: CABG 4 vessel.   FAMILY HISTORY: Positive for hypertension.   SOCIAL HISTORY: No tobacco, alcohol or drug use.   PHYSICAL EXAMINATION: VITAL SIGNS: Temperature 97.9, pulse 92, respirations 22, blood pressure 199/108, 93% on room air.  GENERAL: The patient is alert, oriented, sitting up, is  unable to lay flat, in some mild distress.  HEENT: Head is atraumatic. Pupils are round and reactive. Sclerae anicteric. Mucous membranes are moist. Oropharynx is clear.  NECK: Supple without JVD, carotid bruit, or enlarged thyroid.  CARDIOVASCULAR: Regular rate and rhythm. There is a 2/6 systolic ejection murmur heard best at the right sternal border without radiation. PMI is hard to palpate due to body habitus.  ABDOMEN: Obese, bowel sounds are positive, nontender. Hard to appreciate organomegaly due to body habitus. No rebound or guarding.  LUNGS: He has crackles at the bases with no wheezing, rhonchi, or rales. No dullness to percussion.  EXTREMITIES: He has got 3+ pitting edema up to his  mid thigh.  SKIN : Without rash or lesions.  NEUROLOGIC: Cranial nerves II through XII are grossly intact. No focal deficits. Strength: 5/5 strength in upper and lower extremities.  LABORATORY DATA:  White blood cells 6.8, hemoglobin 13.5, hematocrit 40, platelets 149,000. Sodium 141, potassium 4.0, chloride 111, bicarbonate 23, BUN 17, creatinine 1.09, glucose 154, calcium 9.1. INR 1.1. BNP [id number]. CK 67. CPK-MB 1.3, troponin 0.17.   IMAGING DATA: Chest x-ray is consistent with pulmonary edema.   EKG: Consistent with by bifascicular block. No ST elevation or depression.   ASSESSMENT AND PLAN: This is a 69 year old  male who is noncompliant with medications with a history of coronary artery disease  and coronary artery bypass graft who presents with new onset congestive heart failure.  1. New-onset congestive heart failure due to noncompliance with medications and probable accelerated blood pressure. The patient will be admitted to telemetry. We will need to diurese with Lasix. I have added beta  blocker and ACE inhibitor. Order an echocardiogram. Cardiology consultation and will also check a TSH to be complete for congestive heart failure. Will also monitor I's and o's and daily weights.  2. Non-ST segment  elevation myocardial infarction. The patient's troponin is elevated. This is likely due to his congestive heart failure, but he has underlying coronary artery disease and stents and not taking any medications. His last stents were about 2-1/2 years ago. He was on Plavix. I restarted the Plavix, metoprolol, a statin medication, and ACE inhibitor. Cardiology consultation. The patient on full dose Lovenox, which I reviewed side effects, alternatives, benefits and risks only including bleeding and the patient says that he does want to start full dose Lovenox to protect his heart. We will monitor his hemoglobin closely as the patient will be on Plavix and Lovenox. Continue to cycle his cardiac enzymes. Order an echocardiogram.  3. 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 I have placed the patient on nitroglycerin, metoprolol 50 b.i.d., lisinopril 40 mg daily and I have written for p.r.n. hydralazine order. We will see if this helps his blood pressure. May also have to add to p.o. hydralazine to get his blood pressure better controlled.  4. Noncompliance. I have stressed to the patient in a lengthy conversation that he needs to remain on medications as he has stents, coronary artery bypass graft and coronary artery disease. The patient says many times to me that he gets sick from all the medications.  5. Polyuria. The patient is complaining of polyuria on review of systems. We will order an A1c as his glucose level is elevated.  6. The patient is DO NOT RESUSCITATE status. The patient will need a PCP, case management and physical therapy prior to discharge.   Critical care Time Spent:  55 minutes.    ____________________________ Donell Beers. Benjie Karvonen, MD spm:dw D: 01/04/2014 19:16:57 ET T: 01/04/2014 20:32:49 ET JOB#: [id number]  cc: Alsie Younes P. Benjie Karvonen, MD, <Dictator> Donell Beers Nickie Warwick MD ELECTRONICALLY SIGNED 01/05/2014 14:30

## 2014-12-22 NOTE — Consult Note (Signed)
PATIENT NAME:  Dillon [name], CLABO MR#:  [id number] DATE OF BIRTH:  April 07, 1946  DATE OF CONSULTATION:  01/05/2014  REFERRING PHYSICIAN:  Dr. Benjie Karvonen CONSULTING PHYSICIAN:  Corey Skains, MD  REASON FOR CONSULTATION: Coronary artery disease, hypertension, and hyperlipidemia with elevated troponin, supraventricular tachycardia cardia, and acute on chronic systolic dysfunction congestive heart failure.   CHIEF COMPLAINT: "I am short of breath."   HISTORY OF PRESENT ILLNESS: This is a 69 year old male with known coronary artery disease status post coronary artery bypass graft and previous myocardial infarction who has had difficulty with taking medications due to the fact that he has had significant side effects of these medications. After this discontinuation, he has had some acute on chronic systolic dysfunction, congestive heart failure type symptoms with lower extremity edema, severe shortness of breath and weakness. This is associated with significant weight gain. He now has lower extremity edema and pulmonary edema with hypoxia and elevated troponin of 0.2 most consistent with demand ischemia. The patient also has had an elevated BNP of 689. During admission the patient had normal sinus rhythm with right bundle branch block by EKG, but had a short run of supraventricular tachycardia with asymptomatic by telemetry. The patient has had no further significant symptoms since. Now he is mildly short of breath.   REVIEW OF SYSTEMS: The remainder review of systems is negative for vision change, ringing in the ears, hearing loss, cough, congestion, heartburn, nausea, vomiting, diarrhea, bloody stools, stomach pain, extremity pain, leg weakness, cramping of the buttocks, known blood clots, headaches, blackouts, dizzy spells, nosebleeds, congestion, trouble swallowing, frequent urination, urination at night, muscle weakness, numbness, anxiety, depression, skin lesions or skin rashes.   PAST MEDICAL HISTORY: 1.   Coronary artery disease.  2.  Hypertension.  3.  Hyperlipidemia.  4.  Supraventricular tachycardia.   FAMILY HISTORY: No family members with early onset of cardiovascular disease or hypertension.   SOCIAL HISTORY: He currently denies alcohol or tobacco use.   ALLERGIES: As listed.   MEDICATIONS: As listed.   PHYSICAL EXAMINATION: VITAL SIGNS: Blood pressure is 136/68 bilaterally and heart rate is 72 upright, reclining, and regular.  GENERAL: He is a well appearing male in no acute distress.  HEENT: No icterus, thyromegaly, ulcers, hemorrhage, or xanthelasma.  CARDIOVASCULAR: Regular rate and rhythm. Normal S1 and S2. Distant heart sounds. No apparent murmur, gallop, or rub. PMI is diffuse. Carotid upstroke normal without bruit. Jugular venous pressure is normal.  LUNGS: Decreased breath sounds with crackles in the bases.  ABDOMEN: Soft, nontender. Cannot assess hepatosplenomegaly or masses due to increased abdominal girth.  EXTREMITIES: Show 2+ radial and femoral and trace dorsal pedal pulses with a 2+ lower extremity edema. No cyanosis, clubbing, or ulcers.  NEUROLOGIC: The patient is oriented to time, place, and person with normal mood and affect.   ASSESSMENT: A 69 year old male with acute on chronic systolic dysfunction congestive heart failure status post coronary artery bypass graft, hypertension, and hyperlipidemia with supraventricular tachycardia and demand ischemia.   RECOMMENDATIONS: 1.  Continue intravenous Lasix for edema, pulmonary edema and heart failure.  2.  Continue serial ECG and enzymes to assess for possible myocardial infarction.  3.  Reinstatement of possible beta blocker, ACE inhibitor for cardiomyopathy.  4.  Echocardiogram for extent of cardiomyopathy.  5.  Beta blocker for supraventricular tachycardia. 6.  Aspirin for further risk reduction in cardiovascular event.  6.  Further diagnostic testing and treatment options after above.    ____________________________ Corey Skains, MD bjk:sb  D: 01/05/2014 09:32:40 ET T: 01/05/2014 09:48:03 ET JOB#: [id number]  cc: Corey Skains, MD, <Dictator> Corey Skains MD ELECTRONICALLY SIGNED 01/07/2014 8:01

## 2014-12-22 NOTE — Consult Note (Signed)
PATIENT NAME:  Dillon [name], Dillon [name] MR#:  [id number] DATE OF BIRTH:  1946/01/22  DATE OF CONSULTATION:  04/11/2014.  REFERRING PHYSICIAN:  Dr. Jannifer Franklin.  CONSULTING PHYSICIAN:  Corey Skains, MD  REASON FOR CONSULTATION: Acute on chronic systolic dysfunction congestive heart failure.   CHIEF COMPLAINT: "I'm short of breath."   HISTORY OF PRESENT ILLNESS:  This is a 69 year old male with known coronary artery disease, status post previous coronary artery bypass graft, who has had hypertension and hyperlipidemia on  of medication management with known severe systolic dysfunction with ejection fraction of 25%. He has had appropriate medication management but over the last several weeks has had significant progression of shortness of breath, PND, orthopnea, weakness, fatigue and lower extremity edema. On arrival his BNP was 11,464 and troponin was 0.08, most consistent with acute on chronic systolic dysfunction congestive heart failure rather than acute coronary syndrome.   EKG shows normal sinus rhythm, left axis deviations with right bundle branch block, not changed since before. He did have some short runs of wide-complex tachycardia most consistent with hypoxia rather than worsening cardiovascular disease. The patient does have intravenous Lasix and now has had some improvement in his fluid amounts. He has had interview today for further evaluation of significance of diet and it does appear that his diet is reasonable with low sodium to a reasonable degree.   REVIEW OF SYSTEMS: Negative for vision change, ringing in the ears, hearing loss, cough, congestion, heartburn, nausea, vomiting, diarrhea, bloody stools, stomach pain, extremity pain, leg weakness, cramping of the buttocks, known blood clots, headaches, blackouts, dizzy spells, nosebleeds, frequent urination, urination at night, muscle weakness, numbness, anxiety, depression, skin lesions or skin rashes.   PAST MEDICAL HISTORY:  1.  Congestive  heart failure.  2.  Hypertension.  3.  Coronary disease.  4.  Supraventricular tachycardia.   FAMILY HISTORY: There are a few family members, father and mother, with early onset of cardiovascular disease and hypertension.   SOCIAL HISTORY: Currently denies alcohol and tobacco use.   ALLERGIES TO MEDICATIONS: AS LISTED.   PHYSICAL EXAMINATION:  VITAL SIGNS: Blood pressure 144/68 bilaterally. Heart rate is 70, upright, reclining, and regular.  GENERAL: He is a well-appearing male in no acute distress.  HEENT: No icterus, thyromegaly, ulcers, hemorrhage, or xanthelasma.  CARDIOVASCULAR: Regular rate and rhythm. Normal S1 and S2, a 2/6 apical murmur consistent with mild mitral regurgitation with laterally displaced PMI. Jugular venous pressure is elevated.  LUNGS: Have bibasilar crackles with decreased breath sounds.  ABDOMEN: Soft and nontender. Cannot assess hepatosplenomegaly or masses due to increased abdominal girth.  EXTREMITIES: 2+ radial, trace femoral and dorsal pedal pulses, with 1-2+ lower extremity edema. No cyanosis, clubbing or ulcers.  NEUROLOGIC: He is oriented to time, place, and person, with normal mood and affect.   ASSESSMENT: A 69 year old male with acute on chronic systolic dysfunction congestive heart failure, hypertension, hyperlipidemia, coronary artery disease with elevated BNP and minimal elevation of troponin, most consistent with demand ischemia rather than acute coronary syndrome.   RECOMMENDATIONS:  1.  Continue intravenous Lasix for further reduction of pulmonary edema, lower extremity edema and congestive heart failure.  2.  Serial EKG and enzymes to assess for possible myocardial infarction.  3.  Continue beta blocker, ACE inhibitor, isosorbide for treatment of cardiovascular risk.  4.  Continue dietary intervention as able to help with further improvements long term. Of concern is that the patient has had difficulty with higher sodium and watching for chronic  kidney  disease.  5.  Further consideration of echocardiogram for LV dysfunction and further evaluation thereof as necessary.    ____________________________ Corey Skains, MD bjk:lt D: 04/11/2014 08:18:08 ET T: 04/11/2014 10:01:09 ET JOB#: [id number]  cc: Corey Skains, MD, <Dictator> Corey Skains MD ELECTRONICALLY SIGNED 04/12/2014 12:47

## 2014-12-22 NOTE — Discharge Summary (Signed)
PATIENT NAME:  Dillon [name], Dillon [name] MR#:  [id number] DATE OF BIRTH:  [birth year]  DATE OF ADMISSION:  01/04/2014 DATE OF DISCHARGE:  01/07/2014  PRIMARY CARE PHYSICIAN: Hopefully will be at the [facility].   CARDIOLOGIST: Dr. Nehemiah Massed.   FINAL DIAGNOSES:  1. Acute systolic congestive heart failure with cardiomyopathy with moderate mitral regurgitation.  2. Accelerated hypertension.  3. Elevated troponin which is demand ischemia.  4, Benign prostatic hypertrophy.   MEDICATIONS ON DISCHARGE: Include lisinopril 40 mg daily, Flomax 0.4 mg at bedtime, nitroglycerin 0.4 mg sublingually as needed for chest pain, Imdur 60 mg extended release daily, pravastatin 40 mg at bedtime, metoprolol extended release 25 mg at bedtime, Plavix 75 mg daily, hydralazine 50 mg twice a day, furosemide 20 mg daily.   DIET: Low-sodium diet, regular consistency.   ACTIVITY: As tolerated.   FOLLOWUP: Cardiology, Dr. Nehemiah Massed, this week. Two to 4 weeks with medical doctor and Dr. Alveria Apley office.   HOSPITAL COURSE: The patient was admitted 01/04/2014 and discharged 01/07/2014. The patient came in with chest pain, shortness of breath and lower extremity edema. He was admitted with new onset congestive heart failure, elevated troponin and malignant hypertension.   LABORATORY AND RADIOLOGICAL DATA DURING THE HOSPITAL COURSE: Included an EKG that showed sinus rhythm, PVCs, right bundle branch block, left posterior fascicular block, left atrial enlargement. Hemoglobin A1c 6.0. INR 1.1. BNP [id number]. Glucose 154, BUN 7, creatinine 1.09, sodium 141, potassium 4.0, chloride 111, CO2 23. White blood cell count 6.8, H and H 13.5 and 39.8, platelet count of 148. Chest x-ray showed findings consistent with congestive heart failure. Echocardiogram showed an EF of 25% to 30%, moderately dilated right and left atrium, moderate mitral valve regurgitation. Ultrasound of the lower extremities: No evidence of DVT. Troponin went up to 0.2. TSH  3.29. LDL 129, HDL 29, triglycerides 93. Creatinine went as high as 1.5. Lasix was held then. Creatinine came down to 1.26 on discharge.   HOSPITAL COURSE PER PROBLEM LIST:  1. For the patient's acute systolic congestive heart failure with cardiomyopathy and moderate mitral regurgitation, the patient was diuresed initially with 40 mg IV Lasix q.8 hours, decreased to 20 mg IV q.12 hours, held for a day and then restarted at 20 mg daily as outpatient. The patient was started on low-dose metoprolol, limited secondary to bradycardia at 25 mg. He was also started on hydralazine, Imdur and lisinopril. The patient's lungs were clear upon discharge. The patient also had anasarca and swelling of the legs which had improved. He can wear stockings as outpatient.  2. Accelerated hypertension: Blood pressure now controlled upon discharge with all of the medication changes, 143/74.  3. For his elevated troponin, likely demand ischemia from congestive heart failure, follow up with cardiology as outpatient.  4. BPH: Started on Flomax 0.4 mg at bedtime.  5. Chronic kidney disease stage III: GFR 59 upon discharge. Close follow up as outpatient needed with new medications started and low-dose Lasix. Check a BMP with followup appointment.   TIME SPENT ON DISCHARGE: 35 minutes.    ____________________________ Tana Conch. Leslye Peer, MD rjw:gb D: 01/07/2014 14:16:14 ET T: 01/08/2014 01:27:43 ET JOB#: [id number]  cc: Tana Conch. Leslye Peer, MD, <Dictator> Corey Skains, MD Marisue Brooklyn MD ELECTRONICALLY SIGNED 01/17/2014 14:02

## 2014-12-22 NOTE — H&P (Signed)
PATIENT NAME:  Dillon [name], Dillon [name] MR#:  [id number] DATE OF BIRTH:  27-Mar-1946  DATE OF ADMISSION:  01/22/2014  REFERRING PHYSICIAN: Dr. Francene Castle.   PRIMARY CARE PHYSICIAN: None.   CARDIOLOGIST: Dr. Nehemiah Massed   CHIEF COMPLAINT: Scrotal swelling.   HISTORY OF PRESENT ILLNESS: This is a 69 year old Caucasian gentleman with history of systolic congestive heart failure, ejection fraction of 25%, hypertension, coronary artery disease status post coronary artery bypass graft, presenting with scrotal swelling. few days duration of lower extremity swelling, which has been gradually worsening as well as associated scrotal swelling. Denies any dysuria or difficulty with urination. He, however, also notes having dyspnea on exertion and orthopnea which has been progressively worsening for the same duration of time. He states compliance with medication as well as dietary restrictions. With the above symptoms, he was somewhat concerned; however, he also had chest pain about two hours prior to arrival to the Emergency Department, which finally prompted his visit. The chest pain is retrosternal in location, nonradiating, dull in quality, 7 to 8 out of 10 in intensity. No worsening or relieving factors. No further associated symptoms. Currently without complaints other than edema as described above.    REVIEW OF SYSTEMS:  CONSTITUTIONAL: Denies fever, fatigue, weakness.  EYES: Denies blurred vision, double vision, eye pain.  HEENT: Denies tinnitus, ear pain or hearing loss.  RESPIRATORY: Denies cough or wheeze. Positive for dyspnea on exertion.  CARDIOVASCULAR: Positive for chest pain, orthopnea, and edema as described above.  GASTROINTESTINAL: Denies nausea, vomiting, diarrhea, abdominal pain.  GENITOURINARY: Denies dysuria, hematuria.  ENDOCRINE: Denies nocturia or thyroid problems.  HEMATOLOGIC AND LYMPHATIC: Denies easy bruising, bleeding.  SKIN: Denies rash or lesion.  MUSCULOSKELETAL: Denies pain in  neck, back, shoulder, knees, hips or arthritic symptoms.  NEUROLOGIC: Denies paralysis, paresthesias.  PSYCHIATRIC: Denies anxiety or depression.  Otherwise, full review of systems performed, which is negative.   PAST MEDICAL HISTORY: Hypertension, coronary artery disease, status post CABG.   SOCIAL HISTORY: Remote tobacco and alcohol usage. Denies any drug usage.   FAMILY HISTORY: Positive for hypertension.   ALLERGIES: No known drug allergies.   HOME MEDICATIONS: Include lisinopril 40 mg p.o. daily, Flomax 0.4 mg p.o. at bedtime, Imdur 60 mg p.o. daily, nitroglycerin 0.4 mg sublingual p.r.n. as needed for chest pain, pravastatin 40 mg p.o. at bedtime, Plavix 75 mg p.o. daily, metoprolol 25 mg extended release p.o. daily, Lasix 20 mg p.o. daily, hydralazine 50 mg p.o. b.i.d.   PHYSICAL EXAMINATION: VITAL SIGNS: Temperature 97.9, heart rate 85, respirations 20, blood pressure 172/89, saturating 95% on room air.   GENERAL: Well-nourished, well-developed, Caucasian gentleman, currently in no acute distress.  HEAD: Normocephalic, atraumatic.  EYES: Pupils equal round, and reactive to light. Extraocular muscles intact. No scleral icterus.  MOUTH: Moist mucous membranes.  Dentition intact. No abscess noted.  EARS, NOSE, AND THROAT: Clear, without exudates. No external lesions.  NECK: Supple. No thyromegaly. No nodules. No JVD.  PULMONARY: Clear to auscultation bilaterally, without wheezes, rales, or rhonchi. No accessory muscle use.  Good respiratory effort. CHEST: Nontender to palpation.  CARDIOVASCULAR: S1 and S2. Regular rate and rhythm. No murmurs, rubs or gallops. Has 2+ edema to the thighs bilaterally. Pedal pulses 2+ bilaterally.  GENITOURINARY: Scrotal edema noted. Nontender to palpation. GASTROINTESTINAL: Soft, nontender, nondistended. No masses. Positive bowel sounds. No hepatosplenomegaly.  MUSCULOSKELETAL: No swelling, clubbing. Positive for edema as described above. Range of motion  full in all extremities.  NEUROLOGIC: Cranial nerves II through XII intact. No  gross focal neurologic deficits. Sensation intact, reflexes intact. SKIN: No ulcerations, lesions, rash, or cyanosis. Skin warm, dry. Turgor intact. Multiple tattoos over arms, legs, and penis.  PSYCHIATRIC: Mood and affect within normal limits. The patient is awake, alert, oriented x3. Insight and judgment intact.   LABORATORY DATA: EKG revealing right bundle branch block. Sodium 143, potassium 3.9, chloride 110, bicarbonate 27, BUN 16, creatinine 1.26, glucose 175, BNP 5488. LFTs: Alkaline phosphatase of 132, otherwise within normal limits. Troponin I less than 0.05. WBC 6.3, hemoglobin 12.1, platelets of 158. Chest x-ray performed: Persistent atelectasis, most prominent in the left lower lobe, as well as pulmonary edema noted, however, somewhat improved from prior films.   ASSESSMENT AND PLAN: A 69 year old gentleman with history of systolic congestive heart failure, ejection fraction 25%, presenting with scrotal swelling.  1.  Systolic congestive heart failure exacerbation. Provide diuresis with IV Lasix, follow ins and outs, daily weights, and urine output. Consult cardiology, Dr. Nehemiah Massed, as he is well-known to him. Continue with Imdur, hydralazine, and beta blockers.  2.  Chest pain. Admit to telemetry under observational status. Trend cardiac enzymes x3. Continue with aspirin and statin therapy, as well as beta blockade.  3.  Hypertension. Continue with beta blockers, lisinopril and other home medications.  4.  Deep venous thrombosis prophylaxis with heparin subcutaneous.   CODE STATUS: The patient is full code.  TIME SPENT: 45 minutes.   ____________________________ Aaron Mose. Karma Hiney, MD dkh:cg D: 01/22/2014 22:59:36 ET T: 01/22/2014 23:14:57 ET JOB#: [id number]  cc: Aaron Mose. Jazlynne Milliner, MD, <Dictator> Maham Quintin Woodfin Ganja MD ELECTRONICALLY SIGNED 01/24/2014 3:26

## 2014-12-22 NOTE — Discharge Summary (Signed)
PATIENT NAME:  Dillon [name], Dillon [name] MR#:  [id number] DATE OF BIRTH:  Dec 22, 1945  DATE OF ADMISSION:  01/23/2014 DATE OF DISCHARGE:  01/24/2014  For a detailed note, please take a look at the history and physical done on admission by Dr. Valentino Nose.   DIAGNOSES AT DISCHARGE: As follows: Acute on chronic congestive heart failure, likely systolic dysfunction. Elevated troponin, likely secondary to demand ischemia. Hypertension. Hyperlipidemia. Benign prostatic hypertrophy.   DISCHARGE DIET:  The patient is being discharged on a low-sodium, low-fat diet.   ACTIVITY: As tolerated.   FOLLOWUP:  With Dr. Serafina Royals in the next 1 to 2 weeks.   DISCHARGE MEDICATIONS:  Lisinopril 40 mg daily, Flomax 0.4 mg daily, sublingual nitroglycerin as needed, Imdur 60 mg daily, Pravachol 40 mg daily, Toprol 25 mg at bedtime, Plavix 75 mg daily, hydralazine 50 mg b.i.d., Lasix 40 mg daily and potassium 10 mEq daily.   CONSULTANTS DURING HOSPITAL COURSE: Dr. Lujean Amel from cardiology.   PERTINENT LABORATORY, DIAGNOSTIC AND RADIOLOGICAL DATA DONE DURING THE HOSPITAL COURSE: Are as follows: A chest x-ray done on admission showing improved congestive heart failure, persistent atelectasis versus infiltrate in the left lower lobe.   HOSPITAL COURSE: This is a 69 year old male who presented to the hospital with shortness of breath, scrotal edema and lower extremity edema and chest pain.  1.  Congestive heart failure. This was likely the cause of the patient's scrotal edema and also lower extremity edema. The patient was admitted to the hospital, started on IV diuresis with Lasix and maintained on his beta blockers and ACE inhibitors. The patient responded well to IV diuretics and is about 5 liters negative since admission and clinically feels much better. This was likely acute on chronic systolic dysfunction as the patient has underlying cardiomyopathy, ejection fraction of 25%. The patient at this point is being  discharged on a higher dose of Lasix along with his beta blockers and ACE inhibitors as stated. He will follow up with cardiology in the next 1 to 2 weeks.  2.  Chest pain. His chest pain was intermittent and shortly resolved after admission, although he was observed on telemetry, had 3 sets of cardiac markers which turned out to be positive as his troponins trended up as high as 3, although he was chest pain-free and hemodynamically stable. There was some concern for a non-ST-elevation myocardial infarction but the patient was seen by cardiology and they did not think that the patient needed acute cardiologic intervention and this was likely demand ischemia from his congestive heart failure. The patient currently is chest pain-free and hemodynamically stable, therefore is being discharged on his aspirin, Plavix, beta blocker and statin. He was given a heparin drip for 24 hours while in the hospital, but it was shortly thereafter discontinued.  3.  Benign prostatic hypertrophy. The patient had no urinary obstruction. He will continue his Flomax.  4.  Hypertension. The patient remained hemodynamically stable. He will continue his Toprol, Imdur,  lisinopril and hydralazine as stated.  5.  Hyperlipidemia. The patient was maintained on his Pravachol and he will also resume that upon discharge.  6.  CODE STATUS: The patient is a full code.   TIME SPENT: 40 minutes.    ____________________________ Belia Heman. Verdell Carmine, MD vjs:cs D: 01/24/2014 14:45:30 ET T: 01/24/2014 20:52:16 ET JOB#: [id number]  cc: Belia Heman. Verdell Carmine, MD, <Dictator> Corey Skains, MD Henreitta Leber MD ELECTRONICALLY SIGNED 02/03/2014 21:46

## 2014-12-22 NOTE — Consult Note (Signed)
PATIENT NAME:  Dillon [name], Dillon [name] MR#:  [id number] DATE OF BIRTH:  09-25-1945  DATE OF CONSULTATION:  01/23/2014  CONSULTING PHYSICIAN:  Zaniah Titterington D. Clayborn Bigness, MD  REFERRING PHYSICIAN: Dr. Thomasene Lot.   CARDIOLOGIST:  Dr. Nehemiah Massed.  INDICATION: Heart failure, scrotal swelling, shortness of breath.   HISTORY OF PRESENT ILLNESS: The patient is a 69 year old white male with history of systolic congestive heart failure, ejection fraction of 25%, hypertension, coronary artery disease, history of coronary artery bypass surgery, presented with scrotal swelling. He was recently in the hospital about two weeks ago.  He was treated medically for heart failure and did well, but at home, he started noticing scrotal swelling.  It got progressively worse. He finally came to the hospital for evaluation. He states to be complaint with his medications as well as dietary restriction but with the worsening swelling, he got concerned and came to the hospital. He still has some leg swelling. He had some vague atypical 7 out of 10 midsternal chest pain, but no other worsening symptoms. He was mostly concerned about mid section of scrotal swelling.   REVIEW OF SYSTEMS: No blackout spells, syncope. No nausea or vomiting. No fever. No chills. No sweats. No weight loss. No weight gain. No hemoptysis or hematemesis. Denies bright red blood per rectum. No vision change or hearing change. Denies sputum production or cough. He has had leg swelling and scrotal swelling.   PAST MEDICAL HISTORY: Hypertension, coronary artery disease.   PAST SURGICAL HISTORY: Coronary artery bypass surgery.   SOCIAL HISTORY: Smoking, remote alcohol consumption. Denies drug.   FAMILY HISTORY: Hypertension.   ALLERGIES: NONE.   MEDICATIONS: Lisinopril 40 a day, Flomax 0.4, Imdur 60, nitroglycerin 0.4, pravastatin 40 a day, Plavix 75 a day, metoprolol 25 a day, Lasix 20 a day, hydralazine 50 twice a day.   PHYSICAL EXAMINATION: VITAL SIGNS: Blood  pressure 170/80, pulse 85, respiratory rate 16, afebrile.  HEENT: Normocephalic, atraumatic. Pupils equal and reactive to light.  NECK: Supple. No significant JVD, bruits or adenopathy.  LUNGS: Clear to auscultation and percussion. No significant wheezing. No significant rhonchi. Faint rales in the bases.  HEART: Regular rate and rhythm.  S3. Soft S4. PMI displaced. Systolic ejection murmur at the apex.  ABDOMEN: Benign.  EXTREMITIES: Within normal limits.  NEUROLOGIC: Intact.  SKIN: Normal.   LABORATORY DATA:  Sodium 142, potassium 3.9, bicarbonate 27, BUN 16, creatinine 1.26, glucose 175. BNP 5488. LFTs negative. Alkaline phosphatase 132. Troponin 0.05. White count 6.3, hemoglobin 12.1, platelet count 158. Chest x-ray persistent atelectasis in the lower lobes otherwise negative.  EKG had right bundle branch block, rate of 75, nonspecific ST-T changes.   ASSESSMENT:  1.  Congestive heart failure. 2.  Cardiomyopathy.  3.  Scrotal swelling. 4.  chest pain.  5.  Hypertension.  6.  Mild obesity.  7.  Hyperlipidemia.  8.  Mild renal insufficiency.   PLAN:  1.  Agree with current therapy. Slightly elevated troponin at 0.4. Continue to rule out for myocardial infarction.  We will probably treat the patient medically. This is  probably related to demand ischemia. I do not recommend cardiac catheterization at this point. Consider doing it as an outpatient if symptoms improve. Would continue anticoagulation for now, probably heparin. 2.  Echocardiogram for assessment of LV function and wall motion.  3.  Increased diuretic therapy with Lasix to help with heart failure therapy. Continue hydralazine and Imdur as well as ACE inhibitor therapy.  4.  Renal insufficiency. Continue to follow  up renal insufficiency.  We will treat the patient medically for now. I do not recommend Nephrology consult. Continue to follow while on ACE and diuretics.  5.  Chest pain. Again, could represent acute coronary syndrome  but not likely at this point, probably represents demand ischemia. If he stays relatively asymmetric, we may consider increasing Imdur and treat the patient medically. We will defer cath for now and follow up his studies and treat the patient conservatively for now, unless symptoms persist, worsen or recur.   ____________________________ Loran Senters. Clayborn Bigness, MD ddc:dp D: 01/23/2014 12:44:17 ET T: 01/23/2014 13:26:13 ET JOB#: [id number]  cc: Robyne Matar D. Clayborn Bigness, MD, <Dictator> Yolonda Kida MD ELECTRONICALLY SIGNED 03/07/2014 16:41

## 2015-01-20 ENCOUNTER — Inpatient Hospital Stay
Admit: 2015-01-20 | Discharge: 2015-01-20 | Disposition: A | Discharge status: Home / Self Care | Payer: Medicare Other | Attending: Internal Medicine | Admitting: Internal Medicine

## 2015-01-20 ENCOUNTER — Encounter: Payer: Self-pay | Admitting: Emergency Medicine

## 2015-01-20 ENCOUNTER — Inpatient Hospital Stay
Admission: EM | Admit: 2015-01-20 | Discharge: 2015-01-23 | DRG: 292 | Disposition: A | Discharge status: Home Health | Payer: Medicare Other | Attending: Internal Medicine | Admitting: Internal Medicine

## 2015-01-20 ENCOUNTER — Emergency Department: Payer: Medicare Other

## 2015-01-20 DIAGNOSIS — I10 Essential (primary) hypertension: Secondary | ICD-10-CM | POA: Diagnosis present

## 2015-01-20 DIAGNOSIS — E785 Hyperlipidemia, unspecified: Secondary | ICD-10-CM | POA: Diagnosis present

## 2015-01-20 DIAGNOSIS — E876 Hypokalemia: Secondary | ICD-10-CM | POA: Diagnosis present

## 2015-01-20 DIAGNOSIS — I429 Cardiomyopathy, unspecified: Secondary | ICD-10-CM | POA: Diagnosis present

## 2015-01-20 DIAGNOSIS — R1909 Other intra-abdominal and pelvic swelling, mass and lump: Secondary | ICD-10-CM | POA: Diagnosis present

## 2015-01-20 DIAGNOSIS — I252 Old myocardial infarction: Secondary | ICD-10-CM | POA: Diagnosis not present

## 2015-01-20 DIAGNOSIS — R609 Edema, unspecified: Secondary | ICD-10-CM

## 2015-01-20 DIAGNOSIS — I5033 Acute on chronic diastolic (congestive) heart failure: Secondary | ICD-10-CM

## 2015-01-20 DIAGNOSIS — Z87891 Personal history of nicotine dependence: Secondary | ICD-10-CM

## 2015-01-20 DIAGNOSIS — Z951 Presence of aortocoronary bypass graft: Secondary | ICD-10-CM | POA: Diagnosis not present

## 2015-01-20 DIAGNOSIS — I2510 Atherosclerotic heart disease of native coronary artery without angina pectoris: Secondary | ICD-10-CM | POA: Diagnosis present

## 2015-01-20 DIAGNOSIS — I5023 Acute on chronic systolic (congestive) heart failure: Principal | ICD-10-CM | POA: Insufficient documentation

## 2015-01-20 DIAGNOSIS — M7989 Other specified soft tissue disorders: Secondary | ICD-10-CM | POA: Diagnosis present

## 2015-01-20 DIAGNOSIS — I255 Ischemic cardiomyopathy: Secondary | ICD-10-CM | POA: Diagnosis present

## 2015-01-20 DIAGNOSIS — K219 Gastro-esophageal reflux disease without esophagitis: Secondary | ICD-10-CM | POA: Diagnosis present

## 2015-01-20 DIAGNOSIS — Z8249 Family history of ischemic heart disease and other diseases of the circulatory system: Secondary | ICD-10-CM

## 2015-01-20 HISTORY — DX: Hyperlipidemia, unspecified: E78.5

## 2015-01-20 HISTORY — DX: Acute myocardial infarction, unspecified: I21.9

## 2015-01-20 HISTORY — DX: Atherosclerotic heart disease of native coronary artery without angina pectoris: I25.10

## 2015-01-20 HISTORY — DX: Essential (primary) hypertension: I10

## 2015-01-20 HISTORY — DX: Heart failure, unspecified: I50.9

## 2015-01-20 LAB — BASIC METABOLIC PANEL
Anion gap: 9 (ref 5–15)
BUN: 18 mg/dL (ref 6–20)
CALCIUM: 9.2 mg/dL (ref 8.9–10.3)
CO2: 28 mmol/L (ref 22–32)
Chloride: 105 mmol/L (ref 101–111)
Creatinine, Ser: 1.14 mg/dL (ref 0.61–1.24)
GFR calc non Af Amer: >60 mL/min (ref >60)
Glucose, Bld: 180 mg/dL — ABNORMAL HIGH (ref 65–99)
POTASSIUM: 3.7 mmol/L (ref 3.5–5.1)
Sodium: 142 mmol/L (ref 135–145)

## 2015-01-20 LAB — LIPID PANEL
Cholesterol: 169 mg/dL (ref 0–200)
HDL: 28 mg/dL — ABNORMAL LOW (ref >40)
LDL Cholesterol: 120 mg/dL — ABNORMAL HIGH (ref 0–99)
TRIGLYCERIDES: 104 mg/dL (ref <150)
Total CHOL/HDL Ratio: 6 RATIO
VLDL: 21 mg/dL (ref 0–40)

## 2015-01-20 LAB — CBC
HEMATOCRIT: 43.9 % (ref 40.0–52.0)
Hemoglobin: 14.5 g/dL (ref 13.0–18.0)
MCH: 28.8 pg (ref 26.0–34.0)
MCHC: 33 g/dL (ref 32.0–36.0)
MCV: 87.1 fL (ref 80.0–100.0)
Platelets: 146 10*3/uL — ABNORMAL LOW (ref 150–440)
RBC: 5.04 MIL/uL (ref 4.40–5.90)
RDW: 15.4 % — AB (ref 11.5–14.5)
WBC: 7.7 10*3/uL (ref 3.8–10.6)

## 2015-01-20 LAB — URINALYSIS COMPLETE WITH MICROSCOPIC (ARMC ONLY)
BILIRUBIN URINE: NEGATIVE
Bacteria, UA: NONE SEEN
GLUCOSE, UA: NEGATIVE mg/dL
Hgb urine dipstick: NEGATIVE
Ketones, ur: NEGATIVE mg/dL
Leukocytes, UA: NEGATIVE
NITRITE: NEGATIVE
PROTEIN: NEGATIVE mg/dL
Specific Gravity, Urine: 1.004 — ABNORMAL LOW (ref 1.005–1.030)
pH: 6 (ref 5.0–8.0)

## 2015-01-20 LAB — BRAIN NATRIURETIC PEPTIDE: B Natriuretic Peptide: 1740 pg/mL — ABNORMAL HIGH (ref 0.0–100.0)

## 2015-01-20 LAB — TROPONIN I
TROPONIN I: 0.58 ng/mL — AB (ref <0.031)
Troponin I: 0.43 ng/mL — ABNORMAL HIGH (ref <0.031)
Troponin I: 0.43 ng/mL — ABNORMAL HIGH (ref <0.031)

## 2015-01-20 MED ORDER — SENNA 8.6 MG PO TABS: 1.0000 | ORAL_TABLET | Freq: Every day | ORAL | Status: DC | PRN

## 2015-01-20 MED ORDER — FUROSEMIDE 10 MG/ML IJ SOLN
INTRAMUSCULAR | Status: AC
  Administered 2015-01-20: 80 mg via INTRAVENOUS
  Filled 2015-01-20: qty 10

## 2015-01-20 MED ORDER — ACETAMINOPHEN 325 MG PO TABS: 650.0000 mg | ORAL_TABLET | Freq: Four times a day (QID) | ORAL | Status: DC | PRN

## 2015-01-20 MED ORDER — FUROSEMIDE 10 MG/ML IJ SOLN
80.0000 mg | Freq: Once | INTRAMUSCULAR | Status: AC
  Administered 2015-01-20: 80 mg via INTRAVENOUS

## 2015-01-20 MED ORDER — ISOSORBIDE MONONITRATE ER 60 MG PO TB24
60.0000 mg | ORAL_TABLET | Freq: Every day | ORAL | Status: DC
  Administered 2015-01-21 – 2015-01-23 (×3): 60 mg via ORAL
  Filled 2015-01-20 (×3): qty 1

## 2015-01-20 MED ORDER — ENOXAPARIN SODIUM 40 MG/0.4ML ~~LOC~~ SOLN
40.0000 mg | SUBCUTANEOUS | Status: DC
  Administered 2015-01-20 – 2015-01-22 (×3): 40 mg via SUBCUTANEOUS
  Filled 2015-01-20 (×3): qty 0.4

## 2015-01-20 MED ORDER — FUROSEMIDE 10 MG/ML IJ SOLN
40.0000 mg | Freq: Two times a day (BID) | INTRAMUSCULAR | Status: DC
  Administered 2015-01-20 – 2015-01-21 (×2): 40 mg via INTRAVENOUS
  Filled 2015-01-20 (×2): qty 4

## 2015-01-20 MED ORDER — ALUM & MAG HYDROXIDE-SIMETH 200-200-20 MG/5ML PO SUSP: 30.0000 mL | Freq: Four times a day (QID) | ORAL | Status: DC | PRN

## 2015-01-20 MED ORDER — ASPIRIN EC 325 MG PO TBEC
325.0000 mg | DELAYED_RELEASE_TABLET | Freq: Every day | ORAL | Status: DC
  Administered 2015-01-21 – 2015-01-23 (×3): 325 mg via ORAL
  Filled 2015-01-20 (×3): qty 1

## 2015-01-20 MED ORDER — LISINOPRIL 20 MG PO TABS
40.0000 mg | ORAL_TABLET | Freq: Every day | ORAL | Status: DC
  Administered 2015-01-21 – 2015-01-23 (×3): 40 mg via ORAL
  Filled 2015-01-20 (×3): qty 2

## 2015-01-20 MED ORDER — ATORVASTATIN CALCIUM 20 MG PO TABS
40.0000 mg | ORAL_TABLET | Freq: Every day | ORAL | Status: DC
  Administered 2015-01-20 – 2015-01-22 (×3): 40 mg via ORAL
  Filled 2015-01-20 (×4): qty 2

## 2015-01-20 MED ORDER — DOCUSATE SODIUM 100 MG PO CAPS: 100.0000 mg | ORAL_CAPSULE | Freq: Two times a day (BID) | ORAL | Status: DC | PRN

## 2015-01-20 MED ORDER — PANTOPRAZOLE SODIUM 40 MG PO TBEC
40.0000 mg | DELAYED_RELEASE_TABLET | Freq: Every day | ORAL | Status: DC
  Administered 2015-01-21 – 2015-01-23 (×3): 40 mg via ORAL
  Filled 2015-01-20 (×3): qty 1

## 2015-01-20 MED ORDER — ACETAMINOPHEN 650 MG RE SUPP: 650.0000 mg | Freq: Four times a day (QID) | RECTAL | Status: DC | PRN

## 2015-01-20 MED ORDER — ALBUTEROL SULFATE (2.5 MG/3ML) 0.083% IN NEBU: 2.5000 mg | INHALATION_SOLUTION | RESPIRATORY_TRACT | Status: DC | PRN

## 2015-01-20 MED ORDER — NITROGLYCERIN 0.4 MG SL SUBL: 0.4000 mg | SUBLINGUAL_TABLET | SUBLINGUAL | Status: DC | PRN

## 2015-01-20 MED ORDER — METOPROLOL TARTRATE 25 MG PO TABS
25.0000 mg | ORAL_TABLET | Freq: Two times a day (BID) | ORAL | Status: DC
  Administered 2015-01-20 – 2015-01-21 (×3): 25 mg via ORAL
  Filled 2015-01-20 (×4): qty 1

## 2015-01-20 NOTE — Progress Notes (Signed)
*  PRELIMINARY RESULTS* Echocardiogram 2D Echocardiogram has been performed.  Lavell Luster Stills 01/20/2015, 3:35 PM

## 2015-01-20 NOTE — Progress Notes (Signed)
Let MD Tressia Miners know 3rd troponin was 0.58, will follow.

## 2015-01-20 NOTE — H&P (Addendum)
[facility] [place] NAME: Dillon [name]    MR#:  [id number]  DATE OF BIRTH:  [birth year]  DATE OF ADMISSION:  01/20/2015  PRIMARY CARE PHYSICIAN: Dr. Nehemiah Massed  REQUESTING/REFERRING PHYSICIAN: Dr. Corky Downs  CHIEF COMPLAINT:   Chief Complaint  Patient presents with  . Groin Swelling    HISTORY OF PRESENT ILLNESS:  Dillon [name]  is a 69 y.o. male with a known history of CAD status post bypass graft surgery, systolic congestive heart failure last known ejection fraction of 25%, hypertension and hyperlipidemia presents to the hospital secondary to worsening lower extremity edema and also worsening dyspnea. He isn't had Recurrent hospitalizations last year for CHF exacerbation until July. Since then his torsemide was adjusted as an outpatient and he did fine up until recently. For the last 1 week he has noticed worsening swelling of his legs. His fluid usually builds up in his legs and also in his abdomen. His breathing has been stable except for periods of worsening dyspnea. He is not on any home oxygen. He denies any chest pain. He has gained more than 3 pounds in the last week. In the emergency room he was noted to be in CHF exacerbation. Troponin is elevated as well.  PAST MEDICAL HISTORY:   Past Medical History  Diagnosis Date  . Acute MI   . Coronary artery disease   . CHF (congestive heart failure)   . Hypertension   . Hyperlipidemia     PAST SURGICAL HISTORY:   Past Surgical History  Procedure Laterality Date  . Cardiac catheterization    . Coronary artery bypass graft    . Thumb fusion      SOCIAL HISTORY:   History  Substance Use Topics  . Smoking status: Former Smoker    Types: Cigarettes    Quit date: 01/20/1995  . Smokeless tobacco: Not on file  . Alcohol Use: No    FAMILY HISTORY:   Family History  Problem Relation Age of Onset  . CAD Father   . CAD Sister     DRUG ALLERGIES:  No Known  Allergies  REVIEW OF SYSTEMS:   Review of Systems  Constitutional: Negative for fever, chills, weight loss and malaise/fatigue.  HENT: Positive for hearing loss. Negative for ear discharge, ear pain, nosebleeds and tinnitus.   Eyes: Positive for blurred vision. Negative for double vision and photophobia.       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 Negative for cough, hemoptysis and wheezing.   Cardiovascular: Positive for leg swelling. Negative for chest pain, palpitations and orthopnea.  Gastrointestinal: Positive for heartburn, nausea and vomiting. Negative for abdominal pain, diarrhea, constipation and melena.  Genitourinary: Positive for frequency. Negative for dysuria, urgency and hematuria.  Musculoskeletal: Negative for myalgias, back pain and neck pain.  Skin: Negative for rash.  Neurological: Negative for dizziness, tingling, tremors, sensory change, speech change, focal weakness and headaches.  Endo/Heme/Allergies: Does not bruise/bleed easily.  Psychiatric/Behavioral: Negative for depression.    MEDICATIONS AT HOME:   Prior to Admission medications   Medication Sig Start Date End Date Taking? Authorizing Provider  isosorbide mononitrate (IMDUR) 60 MG 24 hr tablet Take 60 mg by mouth daily.   Yes Historical Provider, MD  lisinopril (PRINIVIL,ZESTRIL) 40 MG tablet Take 40 mg by mouth daily.   Yes Historical Provider, MD  metoprolol tartrate (LOPRESSOR) 25 MG tablet Take 25 mg by mouth 2 (two) times daily.  Yes Historical Provider, MD  nitroGLYCERIN (NITROSTAT) 0.4 MG SL tablet Place 1 tablet under the tongue every 5 (five) minutes as needed. For chest pain. May take up to 3 doses. 01/18/14  Yes Historical Provider, MD  torsemide (DEMADEX) 20 MG tablet Take 20 mg by mouth 2 (two) times daily.   Yes Historical Provider, MD      VITAL SIGNS:  Blood pressure 126/80, pulse 56, temperature 97.7 F (36.5 C), temperature  source Oral, resp. rate 13, height 5\' 9"  (1.753 m), weight 102.059 kg (225 lb), SpO2 96 %.  PHYSICAL EXAMINATION:   Physical Exam  GENERAL:  69 y.o.-year-old patient lying in the bed with no acute distress.  EYES: Pupils equal, round, reactive to light and accommodation.Right pupil is opaque possible cataracts.  No scleral icterus. Extraocular muscles intact.  HEENT: Head atraumatic, normocephalic. Oropharynx and nasopharynx clear.  NECK:  Supple, no jugular venous distention. No thyroid enlargement, no tenderness.  LUNGS: Normal breath sounds bilaterally, no wheezing, rales,rhonchi or crepitation. No use of accessory muscles of respiration. Decreased bibasilar breath sounds.  CARDIOVASCULAR: S1, S2 LOVFIE.3/3 systolic murmur present, no rubs or gallops.  ABDOMEN: Soft, nontender, nondistended. Bowel sounds present. No organomegaly or mass.  EXTREMITIES: 3+  pedal edema extending all the way up to the thighs, no cyanosis, or clubbing.  NEUROLOGIC: Cranial nerves II through XII are intact. Muscle strength 5/5 in all extremities. Sensation intact. Gait not checked.  PSYCHIATRIC: The patient is alert and oriented x 3.  SKIN: No obvious rash, lesion, or ulcer.   LABORATORY PANEL:   CBC  Recent Labs Lab 01/20/15 0737  WBC 7.7  HGB 14.5  HCT 43.9  PLT 146*   ------------------------------------------------------------------------------------------------------------------  Chemistries   Recent Labs Lab 01/20/15 0737  NA 142  K 3.7  CL 105  CO2 28  GLUCOSE 180*  BUN 18  CREATININE 1.14  CALCIUM 9.2   ------------------------------------------------------------------------------------------------------------------  Cardiac Enzymes  Recent Labs Lab 01/20/15 0737  TROPONINI 0.43*   ------------------------------------------------------------------------------------------------------------------  RADIOLOGY:  Dg Chest 2 View (if Patient Has Fever And/or Copd)  01/20/2015    CLINICAL DATA:  Groin swelling and shortness of breath for 1 week.  EXAM: CHEST  2 VIEW  COMPARISON:  04/11/2014  FINDINGS: Stable enlargement of the cardiac silhouette with post CABG changes. Chronic pleural and parenchymal densities at the left lung base. No evidence for pulmonary edema. Multilevel degenerative changes in the thoracic spine.  IMPRESSION: No acute chest findings.  Stable cardiomegaly.  Persistent densities at the left lung base have minimally changed.   Electronically Signed   By: Markus Daft M.D.   On: 01/20/2015 08:58    EKG:   Orders placed or performed during the hospital encounter of 01/20/15  . ED EKG  (if patient has PMH of COPD)  . ED EKG  (if patient has PMH of COPD)   EKG-normal sinus rhythm, right bundle-branch block, heart rate of 58. No acute ST-T wave abnormalities.  IMPRESSION AND PLAN:   Dillon [name]  is a 69 y.o. male with a known history of CAD status post bypass graft surgery, systolic congestive heart failure last known ejection fraction of 25%, hypertension and hyperlipidemia presents to the hospital secondary to worsening lower extremity edema and also worsening dyspnea.  #1 Acute on chronic systolic CHF exacerbation-known CHF with last EF of 25% in August 2015 from outpatient echo. Worsening lower extremity edema and also dyspnea. BNP is elevated. Hold torsemide. Started on IV Lasix twice a day. Cardiology  consult. Repeat echocardiogram. Admit to telemetry. Continue metoprolol, lisinopril and statin. Strict I's and O's monitoring, weight checks.  #2. CAD status post bypass graft surgery-stable at this time. Continue cardiac medications. Denies any chest pain. Started on aspirin. Also on Imdur, metoprolol, lisinopril and statin  #3 Hypertension-continue home medications.  #4 Elevated troponins-could be demand ischemia from CHF. No EKG changes acutely. However, with his cardiac history we'll recycle troponins 3. If increasing exponentially then we'll  start IV heparin. Cardiology has been consulted. Aspirin is started. Also on sublingual nitroglycerin as needed for chest pain.  #5 Hyperlipidemia- started on statin. Check lipid profile.  #6 DVT prophylaxis-Lovenox subcutaneously.  #7 Gastroesophageal reflux disease-patient complaining of heartburn and reflux symptoms lately. Start on Protonix daily. Also Maalox when necessary.    All the records are reviewed and case discussed with ED provider. Management plans discussed with the patient, family and they are in agreement.  CODE STATUS: Full code  TOTAL TIME TAKING CARE OF THIS PATIENT: 50 minutes.    Gladstone Lighter M.D on 01/20/2015 at 10:10 AM  Between 7am to 6pm - Pager - [phone]  After 6pm go to [email or web address] - password [email or web address] [facility]  [facility]  Office  [phone]  CC: Primary care physician; No PCP Per Patient

## 2015-01-20 NOTE — Progress Notes (Signed)
Room air. Sinus arrthymia. Takes meds ok. A & O. Standby assist to BR. UA was collected. ECHO completed. Daughter took home meds. Trop are climbing and MD Tressia Miners was made aware. Legs +3 edema. CXr neg. BNP elevated and received IV lasix in the ER. Pt has no further concerns at this time.

## 2015-01-20 NOTE — ED Provider Notes (Signed)
[facility] Emergency Department Provider Note  ____________________________________________  Time seen: 8:30 AM  I have reviewed the triage vital signs and the nursing notes.   HISTORY  Chief Complaint Groin Swelling     HPI Dillon [name] is a 69 y.o. male presents with increased lower cavity swelling bilaterally. He has a history of congestive heart failure with an ejection fraction of approximately 25% as well as coronary artery disease. He has had swelling in his ankles for several weeks but today he became alarmed because the swelling has gone up to his groin. He reports feeling nauseous but denies overt chest pain. He denies shortness of breath. No fevers chills.      Past Medical History  Diagnosis Date  . Acute MI   . Coronary artery disease   . CHF (congestive heart failure)     There are no active problems to display for this patient.   No past surgical history on file.  No current outpatient prescriptions on file.  Allergies Review of patient's allergies indicates no known allergies.  No family history on file.  Social History History  Substance Use Topics  . Smoking status: Never Smoker   . Smokeless tobacco: Not on file  . Alcohol Use: No    Review of Systems  Constitutional: Negative for fever. Eyes: Negative for visual changes. ENT: Negative for sore throat. Cardiovascular: Negative for chest pain. Respiratory: Negative for shortness of breath. Gastrointestinal: Negative for abdominal pain, vomiting and diarrhea. Genitourinary: Negative for dysuria. Musculoskeletal: Negative for back pain. Positive for edema  Skin: Negative for rash. Neurological: Negative for headaches, focal weakness or numbness.   10-point ROS otherwise negative.  ____________________________________________   PHYSICAL EXAM:  VITAL SIGNS: ED Triage Vitals  Enc Vitals Group     BP 01/20/15 0728 146/68 mmHg     Pulse Rate 01/20/15 0728  54     Resp 01/20/15 0728 16     Temp 01/20/15 0727 97.7 F (36.5 C)     Temp Source 01/20/15 0727 Oral     SpO2 01/20/15 0728 99 %     Weight 01/20/15 0728 225 lb (102.059 kg)     Height 01/20/15 0728 5\' 9"  (1.753 m)     Head Cir --      Peak Flow --      Pain Score 01/20/15 0729 4     Pain Loc --      Pain Edu? --      Excl. in [place]? --      Constitutional: Alert and oriented. Well appearing and in no distress. Eyes: Conjunctivae are normal. PERRL. Normal extraocular movements. ENT   Head: Normocephalic and atraumatic.   Nose: No congestion/rhinnorhea.   Mouth/Throat: Mucous membranes are moist.   Neck: No stridor. Hematological/Lymphatic/Immunilogical: No cervical lymphadenopathy. Cardiovascular: Normal rate, regular rhythm. Normal and symmetric distal pulses are present in all extremities. No murmurs, rubs, or gallops. Respiratory: Normal respiratory effort without tachypnea nor retractions. Breath sounds are clear and equal bilaterally. No rales heard Gastrointestinal: Soft and nontender. No distention. There is no CVA tenderness. Genitourinary: deferred Musculoskeletal: 2+ edema to the level of the groin bilaterally Neurologic:  Normal speech and language. No gross focal neurologic deficits are appreciated. Speech is normal.  Skin:  Skin is warm, dry and intact. No rash noted. Psychiatric: Mood and affect are normal. Speech and behavior are normal. Patient exhibits appropriate insight and judgment.  ____________________________________________    LABS (pertinent positives/negatives)  Labs Reviewed  TROPONIN I - Abnormal; Notable for the following:    Troponin I 0.43 (*)    All other components within normal limits  CBC - Abnormal; Notable for the following:    RDW 15.4 (*)    Platelets 146 (*)    All other components within normal limits  BASIC METABOLIC PANEL - Abnormal; Notable for the following:    Glucose, Bld 180 (*)    All other components within  normal limits  BRAIN NATRIURETIC PEPTIDE     ____________________________________________   EKG  ED ECG REPORT I, Lavonia Drafts, the attending physician, personally viewed and interpreted this ECG.   Date: 01/20/2015  EKG Time: 7:34 AM  Rate: 58  Rhythm: sinus bradycardia, RBBB  Axis: Normal axis  Intervals:right bundle branch block  ST&T Change: Nonspecific   ____________________________________________    RADIOLOGY  No pulmonary edema  ____________________________________________   PROCEDURES  Procedure(s) performed: None  Critical Care performed: None ____________________________________________   INITIAL IMPRESSION / ASSESSMENT AND PLAN / ED COURSE  Pertinent labs & imaging results that were available during my care of the patient were reviewed by me and considered in my medical decision making (see chart for details).  Patient with evidence of significant fluid overload, mildly increased work of breathing, significant elevated troponin likely secondary to demand ischemia. I will give IV furosemide and emergency Department that he will require admission for further management  ____________________________________________   FINAL CLINICAL IMPRESSION(S) / ED DIAGNOSES  Final diagnoses:  Acute on chronic diastolic congestive heart failure     Lavonia Drafts, MD 01/20/15 [phone]

## 2015-01-20 NOTE — ED Notes (Signed)
Patient presents to the ED with significant swelling to his groin area and lower extremities.  Patient states today is the first day he noticed swelling to his groin but swelling in his ankles has been apparent for several weeks.  Patient reports feeling nauseous, denies chest pain.  Patient reports history of several heart attacks and bypass surgeries.

## 2015-01-20 NOTE — ED Notes (Signed)
Received critical lab value troponin 0.43.  MD notified.

## 2015-01-20 NOTE — ED Notes (Signed)
Received critical lab value troponin 0.43

## 2015-01-21 ENCOUNTER — Inpatient Hospital Stay: Payer: Medicare Other

## 2015-01-21 LAB — BASIC METABOLIC PANEL
Anion gap: 6 (ref 5–15)
BUN: 20 mg/dL (ref 6–20)
CO2: 29 mmol/L (ref 22–32)
CREATININE: 1.2 mg/dL (ref 0.61–1.24)
Calcium: 8.7 mg/dL — ABNORMAL LOW (ref 8.9–10.3)
Chloride: 106 mmol/L (ref 101–111)
GFR calc Af Amer: >60 mL/min (ref >60)
GFR calc non Af Amer: >60 mL/min (ref >60)
GLUCOSE: 121 mg/dL — AB (ref 65–99)
Potassium: 3.1 mmol/L — ABNORMAL LOW (ref 3.5–5.1)
Sodium: 141 mmol/L (ref 135–145)

## 2015-01-21 LAB — CBC
HCT: 39.7 % — ABNORMAL LOW (ref 40.0–52.0)
Hemoglobin: 13.5 g/dL (ref 13.0–18.0)
MCH: 29.5 pg (ref 26.0–34.0)
MCHC: 34 g/dL (ref 32.0–36.0)
MCV: 86.9 fL (ref 80.0–100.0)
PLATELETS: 126 10*3/uL — AB (ref 150–440)
RBC: 4.57 MIL/uL (ref 4.40–5.90)
RDW: 15.2 % — ABNORMAL HIGH (ref 11.5–14.5)
WBC: 6.7 10*3/uL (ref 3.8–10.6)

## 2015-01-21 LAB — TROPONIN I: Troponin I: 0.48 ng/mL — ABNORMAL HIGH (ref <0.031)

## 2015-01-21 MED ORDER — POTASSIUM CHLORIDE CRYS ER 20 MEQ PO TBCR
40.0000 meq | EXTENDED_RELEASE_TABLET | Freq: Two times a day (BID) | ORAL | Status: AC
  Administered 2015-01-21 – 2015-01-22 (×4): 40 meq via ORAL
  Filled 2015-01-21 (×4): qty 2

## 2015-01-21 MED ORDER — FUROSEMIDE 10 MG/ML IJ SOLN
40.0000 mg | Freq: Three times a day (TID) | INTRAMUSCULAR | Status: DC
  Administered 2015-01-21 – 2015-01-23 (×5): 40 mg via INTRAVENOUS
  Filled 2015-01-21 (×5): qty 4

## 2015-01-21 MED ORDER — POTASSIUM CHLORIDE CRYS ER 20 MEQ PO TBCR
20.0000 meq | EXTENDED_RELEASE_TABLET | Freq: Two times a day (BID) | ORAL | Status: DC
  Administered 2015-01-21: 20 meq via ORAL
  Filled 2015-01-21: qty 1

## 2015-01-21 MED ORDER — SPIRONOLACTONE 25 MG PO TABS
25.0000 mg | ORAL_TABLET | Freq: Every day | ORAL | Status: DC
  Administered 2015-01-21 – 2015-01-23 (×3): 25 mg via ORAL
  Filled 2015-01-21 (×3): qty 1

## 2015-01-21 NOTE — Progress Notes (Signed)
Initial Nutrition Assessment  DOCUMENTATION CODES:     INTERVENTION:   (Meals and Snacks: Cater to patient preferences)  NUTRITION DIAGNOSIS:   (No nutrition concerns at this time)   GOAL:  Patient will meet greater than or equal to 90% of their needs  MONITOR:   (Energy Intake, Electrolyte and Renal Profile, I/O's)  REASON FOR ASSESSMENT:  Other (Comment) (RD Screen- DIagnosis)    ASSESSMENT:  Reason For Admission: CHF PMHx: Past Medical History  Diagnosis Date  . Acute MI   . Coronary artery disease   . CHF (congestive heart failure)   . Hypertension   . Hyperlipidemia     Typical Fluid/ Food Intake: 100% meals recorded per I/O Meal/ Snack Patterns: Patient reports eating a regular diet limiting salt and sugar intake PTA. He reports a good appetite and intake.  Supplements:None  Labs:  Electrolyte and Renal Profile:    Recent Labs Lab 01/20/15 0737 01/21/15 0113  BUN 18 20  CREATININE 1.14 1.20  NA 142 141  K 3.7 3.1*   Meds: Lasix, Lipitor, Aldactone  Physical Findings: n/a Weight Changes: No significant weight changes. Some weight fluctuations with respect to fluid- not related to PO food intake.  Height:  Ht Readings from Last 1 Encounters:  01/20/15 5\' 9"  (1.753 m)    Weight:  Wt Readings from Last 1 Encounters:  01/21/15 221 lb 1.6 oz (100.29 kg)    Ideal Body Weight:     Wt Readings from Last 10 Encounters:  01/21/15 221 lb 1.6 oz (100.29 kg)    BMI:  Body mass index is 32.64 kg/(m^2).  Skin:  Reviewed, no issues  Diet Order:  Diet Carb Modified Fluid consistency:: Thin; Room service appropriate?: Yes  EDUCATION NEEDS:  No education needs identified at this time   Intake/Output Summary (Last 24 hours) at 01/21/15 1532 Last data filed at 01/21/15 1403  Gross per 24 hour  Intake    600 ml  Output   1600 ml  Net  -1000 ml    Last BM:  5/22  Roda Shutters, RDN Pager: [phone] Office: [place]  Level

## 2015-01-21 NOTE — Progress Notes (Addendum)
[place] [facility] [place] NAME: Dillon [name]    MR#:  [id number]  DATE OF BIRTH:  Jul 14, 1946  SUBJECTIVE:  CHIEF COMPLAINT:   Chief Complaint  Patient presents with  . Groin Swelling   Admitted with bilateral leg swelling, started on IV Lasix. Stasis home torsemide was not effective for this. Diuresed well yesterday, decreased urine output today. Renal function stable on the labs. Overall feels better with breathing and has been able to ambulate.  REVIEW OF SYSTEMS:  Review of Systems  Constitutional: Negative for fever and chills.  Respiratory: Positive for shortness of breath. Negative for cough and wheezing.   Cardiovascular: Positive for orthopnea and leg swelling. Negative for chest pain and palpitations.  Gastrointestinal: Negative for nausea, vomiting, abdominal pain, diarrhea and constipation.  Genitourinary: Negative for dysuria.  Neurological: Negative for dizziness, seizures and headaches.    DRUG ALLERGIES:  No Known Allergies  VITALS:  Blood pressure 136/87, pulse 52, temperature 98 F (36.7 C), temperature source Oral, resp. rate 20, height 5\' 9"  (1.753 m), weight 100.29 kg (221 lb 1.6 oz), SpO2 98 %.  PHYSICAL EXAMINATION:  Physical Exam   GENERAL: 69 y.o.-year-old patient lying in the bed with no acute distress.  EYES: Pupils equal, round, reactive to light and accommodation.Right pupil is opaque possible cataracts. No scleral icterus. Extraocular muscles intact.  HEENT: Head atraumatic, normocephalic. Oropharynx and nasopharynx clear.  NECK: Supple, no jugular venous distention. No thyroid enlargement, no tenderness.  LUNGS: Normal breath sounds bilaterally, no wheezing, rales,rhonchi or crepitation. No use of accessory muscles of respiration. Decreased bibasilar breath sounds.  CARDIOVASCULAR: S1, S2 [id number]/4 systolic murmur present, no rubs or gallops.  ABDOMEN: Soft, nontender, nondistended. Bowel  sounds present. No organomegaly or mass.  EXTREMITIES: 3+ pedal edema extending all the way up to the thighs, no cyanosis, or clubbing.  NEUROLOGIC: Cranial nerves II through XII are intact. Muscle strength 5/5 in all extremities. Sensation intact. Gait not checked.  PSYCHIATRIC: The patient is alert and oriented x 3.  SKIN: No obvious rash, lesion, or ulcer.   LABORATORY PANEL:   CBC  Recent Labs Lab 01/21/15 0113  WBC 6.7  HGB 13.5  HCT 39.7*  PLT 126*   ------------------------------------------------------------------------------------------------------------------  Chemistries   Recent Labs Lab 01/21/15 0113  NA 141  K 3.1*  CL 106  CO2 29  GLUCOSE 121*  BUN 20  CREATININE 1.20  CALCIUM 8.7*   ------------------------------------------------------------------------------------------------------------------  Cardiac Enzymes  Recent Labs Lab 01/21/15 0113  TROPONINI 0.48*   ------------------------------------------------------------------------------------------------------------------  RADIOLOGY:  Dg Chest 2 View (if Patient Has Fever And/or Copd)  01/20/2015   CLINICAL DATA:  Groin swelling and shortness of breath for 1 week.  EXAM: CHEST  2 VIEW  COMPARISON:  04/11/2014  FINDINGS: Stable enlargement of the cardiac silhouette with post CABG changes. Chronic pleural and parenchymal densities at the left lung base. No evidence for pulmonary edema. Multilevel degenerative changes in the thoracic spine.  IMPRESSION: No acute chest findings.  Stable cardiomegaly.  Persistent densities at the left lung base have minimally changed.   Electronically Signed   By: Markus Daft M.D.   On: 01/20/2015 08:58    EKG:   Orders placed or performed during the hospital encounter of 01/20/15  . ED EKG  (if patient has PMH of COPD)  . ED EKG  (if patient has PMH of COPD)    ASSESSMENT AND PLAN:   Dillon [name] is a 69 y.o.  male with a known history of CAD status post  bypass graft surgery, systolic congestive heart failure last known ejection fraction of 25%, hypertension and hyperlipidemia presents to the hospital secondary to worsening lower extremity edema and also worsening dyspnea.  #1 Acute on chronic systolic CHF exacerbation-known CHF with last EF of 25% in August 2015 from outpatient echo. Worsening lower extremity edema and also dyspnea.  -Appreciate cardiology consult. Repeat echo ordered and pending. -Increase Lasix to IV 3 times a day today. Monitor renal function closely. -Lower extremity Dopplers have been ordered to rule out DVT.  -Continue metoprolol, lisinopril and statin. Strict I's and O's monitoring, weight checks. -Started on Aldactone.  #2. CAD status post bypass graft surgery-stable at this time. Continue cardiac medications. Denies any chest pain. Started on aspirin. Also on Imdur, metoprolol, lisinopril and statin. Troponins are elevated but stable.  #3 Hypertension-continue home medications.  #4 hypokalemia-due to being on Lasix. Started on potassium supplements. Continue to monitor especially since Lasix dose is being increased.  #5 Hyperlipidemia- started on statin. Check lipid profile.  #6 DVT prophylaxis-Lovenox subcutaneously.  #7 Gastroesophageal reflux disease-patient complaining of heartburn and reflux symptoms lately. Start on Protonix daily. Also Maalox when necessary.    All the records are reviewed and case discussed with Care Management/Social Workerr. Management plans discussed with the patient, family and they are in agreement.  CODE STATUS: Full code  TOTAL TIME TAKING CARE OF THIS PATIENT: 37 minutes.   POSSIBLE D/C IN 1-2 DAYS, DEPENDING ON CLINICAL CONDITION.   Gladstone Lighter M.D on 01/21/2015 at 2:44 PM  Between 7am to 6pm - Pager - [phone]  After 6pm go to [email or web address] - password [email or web address] [facility]  [place] [facility]  Office  [phone]  CC: Primary care physician; No PCP Per  Patient

## 2015-01-21 NOTE — Consult Note (Signed)
CARDIOLOGY CONSULT NOTE  Patient ID: Dillon [name] MRN: [id number] DOB/AGE: 1945-09-20 69 y.o.  Admit date: 01/20/2015 Referring Physician Dr. Tressia Miners Primary Physician   Primary Cardiologist Dr. Nehemiah Massed Reason for Consultation CHF  HPI: 69 yo male with history of cad s/p cabg, history of ischemic cardiomyopathy with ef of 25% who was admitted after presenting to the er with complaints of worsening peripheral edema and sob. He has been treated with spironolactone, po furosemide and more recently torsemide. He states he is compliant with meds and diet. His serum troponin is chronically mildly elevated. Improved with iv lasix.  ROS  Past Medical History  Diagnosis Date  . Acute MI   . Coronary artery disease   . CHF (congestive heart failure)   . Hypertension   . Hyperlipidemia     Family History  Problem Relation Age of Onset  . CAD Father   . CAD Sister     History   Social History  . Marital Status: Unknown    Spouse Name: N/A  . Number of Children: N/A  . Years of Education: N/A   Occupational History  . Not on file.   Social History Main Topics  . Smoking status: Former Smoker    Types: Cigarettes    Quit date: 01/20/1995  . Smokeless tobacco: Not on file  . Alcohol Use: No  . Drug Use: Not on file  . Sexual Activity: Not on file   Other Topics Concern  . Not on file   Social History Narrative  . No narrative on file    Past Surgical History  Procedure Laterality Date  . Cardiac catheterization    . Coronary artery bypass graft    . Thumb fusion       Prescriptions prior to admission  Medication Sig Dispense Refill Last Dose  . isosorbide mononitrate (IMDUR) 60 MG 24 hr tablet Take 60 mg by mouth daily.   01/20/2015 at 0600  . lisinopril (PRINIVIL,ZESTRIL) 40 MG tablet Take 40 mg by mouth daily.   01/20/2015 at 0600  . metoprolol tartrate (LOPRESSOR) 25 MG tablet Take 25 mg by mouth 2 (two) times daily.   01/20/2015 at 0600  . nitroGLYCERIN  (NITROSTAT) 0.4 MG SL tablet Place 1 tablet under the tongue every 5 (five) minutes as needed. For chest pain. May take up to 3 doses.   prn at prn  . torsemide (DEMADEX) 20 MG tablet Take 20 mg by mouth 2 (two) times daily.   01/20/2015 at 0600    Physical Exam: Blood pressure 127/72, pulse 45, temperature 98.1 F (36.7 C), temperature source Oral, resp. rate 19, height 5\' 9"  (1.753 m), weight 100.29 kg (221 lb 1.6 oz), SpO2 99 %.    General appearance: alert and cooperative Head: Normocephalic, without obvious abnormality, atraumatic Resp: rales bibasilar Cardio: prominent apical impulse and regular rate and rhythm GI: soft, non-tender; bowel sounds normal; no masses,  no organomegaly Extremities: edema 4+ bilaterally Neurologic: Grossly normal Labs:   Lab Results  Component Value Date   WBC 6.7 01/21/2015   HGB 13.5 01/21/2015   HCT 39.7* 01/21/2015   MCV 86.9 01/21/2015   PLT 126* 01/21/2015    Recent Labs Lab 01/21/15 0113  NA 141  K 3.1*  CL 106  CO2 29  BUN 20  CREATININE 1.20  CALCIUM 8.7*  GLUCOSE 121*   Lab Results  Component Value Date   TROPONINI 0.48* 01/21/2015      Radiology: CXR revaled no pulmonary  edema, stable cardiomegaly and persistent densities in the left lung base.  EKG: nsr  ASSESSMENT AND PLAN:  Patient with history of ischemic cardio myopathy status post coronary artery bypass grafting. He has been on beta blockers, spironolactone, recent diuresis with torsemide. He is developed worsening peripheral edema. Chest x-ray did not show significant pulmonary edema. He reports compliance with his medications. He has a mild troponin elevation which appears to be secondary to his cardiomyopathy. Echo revealed persistent EF 25-30%. Lower extremity edema is improving with diuresis. We'll continue with IV Lasix, continue spironolactone. Would not proceed with left heart catheter this point as his troponin does not appear to be secondary to acute ischemia. He  also has no significant pulmonary edema by x-ray.  #1. Continue with IV furosemide following renal function #2 continue with afterload reduction, spironolactone and beta blockers #3 low-sodium diet #4 compression stockings to lower extremity #5 further recommendations based on course Signed: Teodoro Spray MD, [facility] 01/21/2015, 7:37 AM

## 2015-01-21 NOTE — Care Management (Signed)
Patient has lived in a boarding house for the past 2 and one half years.  His daughter does not feel that he can be "looked after" at this boarding house, so patient says he is moving in with her.  He uses a cane to ambulate.  Denies issues obtaining medications.  He does not drive due to vision.  He does not have a PCP.  The only physician he see is his cardiologist- Dr Nehemiah Massed.  He follows  Up with him about every 3 months

## 2015-01-21 NOTE — Evaluation (Signed)
Physical Therapy Evaluation Patient Details Name: Dillon [name] MRN: [id number] DOB: 23-May-1946 Today's Date: 01/21/2015   History of Present Illness  69 yo male with onset of B LE edema, CHF and chronic elevation of troponin, currently trending down from admit.  PMHx:  HLD, CAD, MI, CABG, EF 25%.  Clinical Impression  Pt was assessed and is noted to be unsteady on his feet but controlled with SPC and HHA.  His main issue is lower pulse rate with essentially no elevation to walk.  His plan is to go to HHPT with 24 hour supervision but will need to assess family availability to do this.    Follow Up Recommendations Home health PT;Supervision/Assistance - 24 hour    Equipment Recommendations  Rolling walker with 5" wheels    Recommendations for Other Services       Precautions / Restrictions Precautions Precautions: Fall;Other (comment) (telemetry) Restrictions Weight Bearing Restrictions: No      Mobility  Bed Mobility               General bed mobility comments: up when PT entered  Transfers Overall transfer level: Modified independent Equipment used: 1 person hand held assist;Straight cane             General transfer comment: reminders for hand placement  Ambulation/Gait Ambulation/Gait assistance: Min assist;Min guard Ambulation Distance (Feet): 300 Feet Assistive device: Straight cane;1 person hand held assist Gait Pattern/deviations: Shuffle;Wide base of support;Drifts right/left;Step-through pattern;Step-to pattern Gait velocity: reduced Gait velocity interpretation: Below normal speed for age/gender General Gait Details: pt was somewhat unsteady and needed control of balance with belt a few times, mainly feeling somewhat off balance and like he was making an effort  Stairs            Wheelchair Mobility    Modified Rankin (Stroke Patients Only)       Balance Overall balance assessment: Needs assistance Sitting-balance support: Feet  supported Sitting balance-Leahy Scale: Good   Postural control: Posterior lean Standing balance support: Single extremity supported Standing balance-Leahy Scale: Fair Standing balance comment: fair- dynamic balance                             Pertinent Vitals/Pain Pain Assessment: Faces Faces Pain Scale: Hurts little more Pain Location: BLE's on anterior lower legs Pain Descriptors / Indicators:  (Stinging) Pain Intervention(s): Limited activity within patient's tolerance;Monitored during session;Premedicated before session;Repositioned    Home Living Family/patient expects to be discharged to:: Private residence Living Arrangements: Children Available Help at Discharge: Family Type of Home: House Home Access: Stairs to enter Entrance Stairs-Rails: Left Entrance Stairs-Number of Steps: 2 Home Layout: One level Home Equipment: Cane - quad;Cane - single point;Shower seat      Prior Function Level of Independence: Independent with assistive device(s)               Hand Dominance        Extremity/Trunk Assessment   Upper Extremity Assessment: Overall WFL for tasks assessed           Lower Extremity Assessment: Generalized weakness      Cervical / Trunk Assessment: Normal  Communication   Communication: No difficulties  Cognition Arousal/Alertness: Awake/alert Behavior During Therapy: WFL for tasks assessed/performed Overall Cognitive Status: Within Functional Limits for tasks assessed                      General Comments General comments (skin  integrity, edema, etc.): Pt is feeling  unsteady in a non specific way with gait and reports he feels better without compression stockings    Exercises General Exercises - Lower Extremity Ankle Circles/Pumps: AROM;Both;5 reps Quad Sets: AROM;Both;10 reps Gluteal Sets: AROM;Both;10 reps Hip ABduction/ADduction: AROM;Both;10 reps      Assessment/Plan    PT Assessment Patient needs  continued PT services  PT Diagnosis Difficulty walking   PT Problem List Decreased strength;Decreased range of motion;Decreased activity tolerance;Decreased balance;Decreased mobility;Decreased coordination;Cardiopulmonary status limiting activity;Pain;Obesity;Decreased skin integrity  PT Treatment Interventions DME instruction;Gait training;Stair training;Functional mobility training;Therapeutic activities;Therapeutic exercise;Balance training;Neuromuscular re-education;Patient/family education   PT Goals (Current goals can be found in the Care Plan section) Acute Rehab PT Goals Patient Stated Goal: to get back to walking PT Goal Formulation: With patient Time For Goal Achievement: 02/04/15 Potential to Achieve Goals: Good    Frequency Min 2X/week   Barriers to discharge Inaccessible home environment      Co-evaluation               End of Session Equipment Utilized During Treatment: Gait belt Activity Tolerance: Patient tolerated treatment well Patient left: in chair;with call bell/phone within reach;with chair alarm set;with nursing/sitter in room Nurse Communication: Mobility status         Time: 8416-6063 PT Time Calculation (min) (ACUTE ONLY): 36 min   Charges:   PT Evaluation $Initial PT Evaluation Tier I: 1 Procedure PT Treatments $Gait Training: 8-22 mins   PT G CodesRamond Dial 01-28-15, 1:33 PM   Mee Hives, PT MS Acute Rehab Dept. Number: [phone] [id number] and [place] [phone]

## 2015-01-22 LAB — BASIC METABOLIC PANEL
Anion gap: 8 (ref 5–15)
BUN: 23 mg/dL — AB (ref 6–20)
CALCIUM: 8.9 mg/dL (ref 8.9–10.3)
CHLORIDE: 104 mmol/L (ref 101–111)
CO2: 29 mmol/L (ref 22–32)
CREATININE: 1.2 mg/dL (ref 0.61–1.24)
GFR calc non Af Amer: >60 mL/min (ref >60)
Glucose, Bld: 165 mg/dL — ABNORMAL HIGH (ref 65–99)
Potassium: 3.8 mmol/L (ref 3.5–5.1)
Sodium: 141 mmol/L (ref 135–145)

## 2015-01-22 MED ORDER — ONDANSETRON HCL 4 MG/2ML IJ SOLN
4.0000 mg | INTRAMUSCULAR | Status: DC | PRN
  Administered 2015-01-22: 4 mg via INTRAVENOUS

## 2015-01-22 MED ORDER — ONDANSETRON HCL 4 MG/2ML IJ SOLN
INTRAMUSCULAR | Status: AC
  Administered 2015-01-22: 4 mg via INTRAVENOUS
  Filled 2015-01-22: qty 2

## 2015-01-22 NOTE — Progress Notes (Signed)
[place] at [place] NAME: Dillon [name]    MR#:  [id number]  DATE OF BIRTH:  1946/04/30  SUBJECTIVE:  CHIEF COMPLAINT:   Chief Complaint  Patient presents with  . Groin Swelling   Improving lower extremity edema. Lasix dose was increased yesterday. Net negative by 6 L since admission.   REVIEW OF SYSTEMS:  ROS Constitutional: Negative for fever and chills.  Respiratory: Improved breathing.  Negative for cough and wheezing.  Cardiovascular: Positive for orthopnea and leg swelling. Negative for chest pain and palpitations.  Gastrointestinal: Negative for nausea, vomiting, abdominal pain, diarrhea and constipation.  Genitourinary: Negative for dysuria.  Neurological: Negative for dizziness, seizures and headaches.  DRUG ALLERGIES:  No Known Allergies  VITALS:  Blood pressure 128/65, pulse 54, temperature 98.3 F (36.8 C), temperature source Oral, resp. rate 19, height 5\' 9"  (1.753 m), weight 100.426 kg (221 lb 6.4 oz), SpO2 100 %.  PHYSICAL EXAMINATION:  Physical Exam  GENERAL: 69 y.o.-year-old patient lying in the bed with no acute distress.  EYES: Pupils equal, round, reactive to light and accommodation.Right pupil is opaque possible cataracts. No scleral icterus. Extraocular muscles intact.  HEENT: Head atraumatic, normocephalic. Oropharynx and nasopharynx clear.  NECK: Supple, no jugular venous distention. No thyroid enlargement, no tenderness.  LUNGS: Normal breath sounds bilaterally, no wheezing, rales,rhonchi or crepitation. No use of accessory muscles of respiration. Decreased bibasilar breath sounds.  CARDIOVASCULAR: S1, S2 HRCBUL.8/4 systolic murmur present, no rubs or gallops.  ABDOMEN: Soft, nontender, nondistended. Bowel sounds present. No organomegaly or mass.  EXTREMITIES: 3+ pedal edema extending all the way up to the thighs-improved edema today, no cyanosis, or clubbing.  NEUROLOGIC: Cranial nerves  II through XII are intact. Muscle strength 5/5 in all extremities. Sensation intact. Gait not checked.  PSYCHIATRIC: The patient is alert and oriented x 3.  SKIN: No obvious rash, lesion, or ulcer.   LABORATORY PANEL:   CBC  Recent Labs Lab 01/21/15 0113  WBC 6.7  HGB 13.5  HCT 39.7*  PLT 126*   ------------------------------------------------------------------------------------------------------------------  Chemistries   Recent Labs Lab 01/22/15 0434  NA 141  K 3.8  CL 104  CO2 29  GLUCOSE 165*  BUN 23*  CREATININE 1.20  CALCIUM 8.9   ------------------------------------------------------------------------------------------------------------------  Cardiac Enzymes  Recent Labs Lab 01/21/15 0113  TROPONINI 0.48*   ------------------------------------------------------------------------------------------------------------------  RADIOLOGY:  US Venous Img Lower Bilateral  01/21/2015   CLINICAL DATA:  Bilateral lower extremity pain and swelling for 3 weeks. Evaluate for DVT.  EXAM: BILATERAL LOWER EXTREMITY VENOUS DOPPLER ULTRASOUND  TECHNIQUE: Gray-scale sonography with graded compression, as well as color Doppler and duplex ultrasound were performed to evaluate the lower extremity deep venous systems from the level of the common femoral vein and including the common femoral, femoral, profunda femoral, popliteal and calf veins including the posterior tibial, peroneal and gastrocnemius veins when visible. The superficial great saphenous vein was also interrogated. Spectral Doppler was utilized to evaluate flow at rest and with distal augmentation maneuvers in the common femoral, femoral and popliteal veins.  COMPARISON:  None.  FINDINGS: RIGHT LOWER EXTREMITY  Common Femoral Vein: No evidence of thrombus. Normal compressibility, respiratory phasicity and response to augmentation.  Saphenofemoral Junction: No evidence of thrombus. Normal compressibility and flow on color  Doppler imaging.  Profunda Femoral Vein: No evidence of thrombus. Normal compressibility and flow on color Doppler imaging.  Femoral Vein: No evidence of thrombus. Normal compressibility, respiratory phasicity and response to augmentation.  Popliteal Vein: No evidence of thrombus. Normal compressibility, respiratory phasicity and response to augmentation.  Calf Veins: No evidence of thrombus. Normal compressibility and flow on color Doppler imaging.  Superficial Great Saphenous Vein: No evidence of thrombus. Normal compressibility and flow on color Doppler imaging.  Venous Reflux:  None.  Other Findings: Subcutaneous edema is noted at the level of the right ankle (image 29).  LEFT LOWER EXTREMITY  Common Femoral Vein: No evidence of thrombus. Normal compressibility, respiratory phasicity and response to augmentation.  Saphenofemoral Junction: No evidence of thrombus. Normal compressibility and flow on color Doppler imaging.  Profunda Femoral Vein: No evidence of thrombus. Normal compressibility and flow on color Doppler imaging.  Femoral Vein: No evidence of thrombus. Normal compressibility, respiratory phasicity and response to augmentation.  Popliteal Vein: No evidence of thrombus. Normal compressibility, respiratory phasicity and response to augmentation.  Calf Veins: No evidence of thrombus. Normal compressibility and flow on color Doppler imaging.  Superficial Great Saphenous Vein: No evidence of thrombus. Normal compressibility and flow on color Doppler imaging.  Venous Reflux:  None.  Other Findings: A minimal amount of subcutaneous edema is noted at the level of the left lower leg (image 61).  IMPRESSION: No evidence of DVT within either lower extremity.   Electronically Signed   By: Sandi Mariscal M.D.   On: 01/21/2015 22:04    EKG:   Orders placed or performed during the hospital encounter of 01/20/15  . ED EKG  (if patient has PMH of COPD)  . ED EKG  (if patient has PMH of COPD)  . EKG 12-Lead  .  EKG 12-Lead    ASSESSMENT AND PLAN:   Dillon [name] is a 69 y.o. male with a known history of CAD status post bypass graft surgery, systolic congestive heart failure last known ejection fraction of 25%, hypertension and hyperlipidemia presents to the hospital secondary to worsening lower extremity edema and also worsening dyspnea.  #1 Acute on chronic systolic CHF exacerbation-known CHF with last EF of 25% in August 2015 from outpatient echo. Worsening lower extremity edema and also dyspnea.  -Appreciate cardiology consult. Repeat echo with-  Systolic function was severely reduced. The estimated ejection fraction was in the range of 25% to 30%. Akinesis of the apical myocardium. - Regional wall motion abnormality: Akinesis of the apical septal and apical myocardium. On Lasix IV 3 times a day. Monitor renal function closely. -Lower extremity Dopplers negative for any DVT.  -Continue metoprolol, lisinopril and statin. Strict I's and O's monitoring-negative by 6 L since admission, weight checks. -Started on Aldactone. -Changed to by mouth Lasix twice a day and also add metolazone or HCTZ tomorrow  #2. CAD status post bypass graft surgery-stable at this time. Continue cardiac medications. Denies any chest pain. Started on aspirin. Also on Imdur, metoprolol, lisinopril and statin. Troponins are elevated but stable.  #3 Hypertension-continue home medications.  #4 hypokalemia-due to being on Lasix. on potassium supplements. Continue to monitor especially since Lasix dose is being increased. Stable potassium  #5 Hyperlipidemia- started on statin. LDL is 120  #6 DVT prophylaxis-Lovenox subcutaneously.  #7 Gastroesophageal reflux disease-patient complaining of heartburn and reflux symptoms lately. Start on Protonix daily. Also Maalox when necessary.    All the records are reviewed and case discussed with Care Management/Social Workerr. Management plans discussed with the patient,  family and they are in agreement.  CODE STATUS: Full code  TOTAL TIME TAKING CARE OF THIS PATIENT: 37 minutes.   POSSIBLE D/C IN 1-2 DAYS,  DEPENDING ON CLINICAL CONDITION.   Gladstone Lighter M.D on 01/22/2015 at 2:05 PM  Between 7am to 6pm - Pager - [phone]  After 6pm go to [email or web address] - password [email or web address] [facility]  [place] [facility]  Office  [phone]  CC: Primary care physician; No PCP Per Patient

## 2015-01-22 NOTE — Progress Notes (Signed)
NOtified Dr. Verdell Carmine of BP of 97/59 and heartrate of 50. Holding lasix and Toprol.

## 2015-01-23 LAB — BASIC METABOLIC PANEL
ANION GAP: 6 (ref 5–15)
BUN: 26 mg/dL — AB (ref 6–20)
CALCIUM: 9 mg/dL (ref 8.9–10.3)
CO2: 32 mmol/L (ref 22–32)
CREATININE: 1.33 mg/dL — AB (ref 0.61–1.24)
Chloride: 103 mmol/L (ref 101–111)
GFR calc Af Amer: >60 mL/min (ref >60)
GFR calc non Af Amer: 53 mL/min — ABNORMAL LOW (ref >60)
GLUCOSE: 118 mg/dL — AB (ref 65–99)
POTASSIUM: 4.1 mmol/L (ref 3.5–5.1)
Sodium: 141 mmol/L (ref 135–145)

## 2015-01-23 MED ORDER — ISOSORBIDE MONONITRATE ER 30 MG PO TB24: 30.0000 mg | ORAL_TABLET | Freq: Every day | ORAL | Status: AC

## 2015-01-23 MED ORDER — ATORVASTATIN CALCIUM 40 MG PO TABS: 40.0000 mg | ORAL_TABLET | Freq: Every day | ORAL | Status: DC

## 2015-01-23 MED ORDER — SPIRONOLACTONE 25 MG PO TABS: 25.0000 mg | ORAL_TABLET | Freq: Every day | ORAL | Status: AC

## 2015-01-23 MED ORDER — PANTOPRAZOLE SODIUM 40 MG PO TBEC: 40.0000 mg | DELAYED_RELEASE_TABLET | Freq: Every day | ORAL | Status: DC

## 2015-01-23 MED ORDER — ASPIRIN 81 MG PO TBEC: 81.0000 mg | DELAYED_RELEASE_TABLET | Freq: Every day | ORAL | Status: AC

## 2015-01-23 MED ORDER — FUROSEMIDE 40 MG PO TABS: 40.0000 mg | ORAL_TABLET | Freq: Two times a day (BID) | ORAL | Status: AC

## 2015-01-23 MED ORDER — POTASSIUM CHLORIDE ER 20 MEQ PO TBCR
20.0000 meq | EXTENDED_RELEASE_TABLET | Freq: Every day | ORAL | Status: DC
Start: 2015-01-23 — End: 2015-01-23

## 2015-01-23 MED ORDER — POTASSIUM CHLORIDE ER 20 MEQ PO TBCR
20.0000 meq | EXTENDED_RELEASE_TABLET | Freq: Every day | ORAL | Status: AC
Start: 2015-01-23 — End: ?

## 2015-01-23 MED ORDER — METOLAZONE 2.5 MG PO TABS
2.5000 mg | ORAL_TABLET | ORAL | Status: DC | PRN
Start: 2015-01-23 — End: 2015-02-11

## 2015-01-23 NOTE — Discharge Summary (Signed)
Dillon [name] at [place] NAME: Dillon [name]    MR#:  [id number]  DATE OF BIRTH:  April 02, 1946  DATE OF ADMISSION:  01/20/2015 ADMITTING PHYSICIAN: Gladstone Lighter, MD  DATE OF DISCHARGE: 01/23/2015  PRIMARY CARE PHYSICIAN: No PCP Per Patient    ADMISSION DIAGNOSIS:  Acute on chronic diastolic congestive heart failure [I50.33]  DISCHARGE DIAGNOSIS:  Principal Problem:   Systolic CHF, acute on chronic   SECONDARY DIAGNOSIS:   Past Medical History  Diagnosis Date  . Acute MI   . Coronary artery disease   . CHF (congestive heart failure)   . Hypertension   . Hyperlipidemia    chronic lower extremity edema  HOSPITAL COURSE:   Dillon [name] is a 69 y.o. male with a known history of CAD status post bypass graft surgery, systolic congestive heart failure last known ejection fraction of 25%, hypertension and hyperlipidemia presents to the hospital secondary to worsening lower extremity edema and also worsening dyspnea.  #1 Acute on chronic systolic CHF exacerbation-known CHF with last EF of 25% in August 2015 from outpatient echo. Worsening lower extremity edema and also dyspnea.  -Appreciate cardiology consult. Repeat echo with- Systolic function was severely reduced. The estimated ejection fraction was in the range of 25% to 30%. Akinesis of the apical myocardium. - Regional wall motion abnormality: Akinesis of the apical septal and apical myocardium. -Lower extremity Dopplers negative for any DVT.  -Continue metoprolol, lisinopril and statin. Strict I's and O's monitoring-negative by 6 L since admission, weight checks. -Started on Aldactone. -Changed to by mouth Lasix twice a day and also added metolazone when necessary at discharge  #2. CAD status post bypass graft surgery-stable at this time. Continue cardiac medications. Denies any chest pain. Started on aspirin. Also on Imdur, metoprolol, lisinopril and statin.  Aldactone added Troponins are elevated but stable.  #3 Hypertension-continue home medications. Imdur dose is decreased at the time of discharge as Aldactone is being added.  #4 hypokalemia-due to being on Lasix. on potassium supplements.   #5 Hyperlipidemia- started on statin. LDL is 120  #6 Gastroesophageal reflux disease-patient complaining of heartburn and reflux symptoms lately. on Protonix daily.  DISCHARGE CONDITIONS:   Stable  CONSULTS OBTAINED:  Treatment Team:  Teodoro Spray, MD  DRUG ALLERGIES:  No Known Allergies  DISCHARGE MEDICATIONS:   Current Discharge Medication List    START taking these medications   Details  aspirin EC 81 MG EC tablet Take 1 tablet (81 mg total) by mouth daily. Qty: 30 tablet, Refills: 1    atorvastatin (LIPITOR) 40 MG tablet Take 1 tablet (40 mg total) by mouth daily at 6 PM. Qty: 30 tablet, Refills: 1    furosemide (LASIX) 40 MG tablet Take 1 tablet (40 mg total) by mouth 2 (two) times daily. Qty: 30 tablet, Refills: 1    metolazone (ZAROXOLYN) 2.5 MG tablet Take 1 tablet (2.5 mg total) by mouth every other day as needed. Qty: 15 tablet, Refills: 0    pantoprazole (PROTONIX) 40 MG tablet Take 1 tablet (40 mg total) by mouth daily. Qty: 30 tablet, Refills: 1    Potassium Chloride ER 20 MEQ TBCR Take 20 mEq by mouth daily. Qty: 30 tablet, Refills: 1    spironolactone (ALDACTONE) 25 MG tablet Take 1 tablet (25 mg total) by mouth daily. Qty: 30 tablet, Refills: 1      CONTINUE these medications which have CHANGED   Details  isosorbide mononitrate (IMDUR) 30 MG 24  hr tablet Take 1 tablet (30 mg total) by mouth daily. Qty: 30 tablet, Refills: 1      CONTINUE these medications which have NOT CHANGED   Details  lisinopril (PRINIVIL,ZESTRIL) 40 MG tablet Take 40 mg by mouth daily.    metoprolol tartrate (LOPRESSOR) 25 MG tablet Take 25 mg by mouth 2 (two) times daily.    nitroGLYCERIN (NITROSTAT) 0.4 MG SL tablet Place 1  tablet under the tongue every 5 (five) minutes as needed. For chest pain. May take up to 3 doses.      STOP taking these medications     torsemide (DEMADEX) 20 MG tablet          DISCHARGE INSTRUCTIONS:   1. Cardiology follow-up with Dr. Nehemiah Massed or Dr. Linda Hedges in 1-2 weeks 2. Low sodium diet strictly 3. Restrict fluid intake to less than 1500 cc per day 4. Home health nursing  If you experience worsening of your admission symptoms, develop shortness of breath, life threatening emergency, suicidal or homicidal thoughts you must seek medical attention immediately by calling 911 or calling your MD immediately  if symptoms less severe.  You Must read complete instructions/literature along with all the possible adverse reactions/side effects for all the Medicines you take and that have been prescribed to you. Take any new Medicines after you have completely understood and accept all the possible adverse reactions/side effects.   Please note  You were cared for by a hospitalist during your hospital stay. If you have any questions about your discharge medications or the care you received while you were in the hospital after you are discharged, you can call the unit and asked to speak with the hospitalist on call if the hospitalist that took care of you is not available. Once you are discharged, your primary care physician will handle any further medical issues. Please note that NO REFILLS for any discharge medications will be authorized once you are discharged, as it is imperative that you return to your primary care physician (or establish a relationship with a primary care physician if you do not have one) for your aftercare needs so that they can reassess your need for medications and monitor your lab values.    Today   CHIEF COMPLAINT:   Chief Complaint  Patient presents with  . Groin Swelling    VITAL SIGNS:  Blood pressure 144/76, pulse 56, temperature 98.3 F (36.8 C),  temperature source Oral, resp. rate 20, height 5\' 9"  (1.753 m), weight 98.236 kg (216 lb 9.1 oz), SpO2 100 %.  I/O:   Intake/Output Summary (Last 24 hours) at 01/23/15 1002 Last data filed at 01/23/15 0500  Gross per 24 hour  Intake    480 ml  Output   1650 ml  Net  -1170 ml    PHYSICAL EXAMINATION:   Physical Exam  GENERAL: 69 y.o.-year-old patient lying in the bed with no acute distress.  EYES: Pupils equal, round, reactive to light and accommodation.Right pupil is opaque possible cataracts. No scleral icterus. Extraocular muscles intact.  HEENT: Head atraumatic, normocephalic. Oropharynx and nasopharynx clear.  NECK: Supple, no jugular venous distention. No thyroid enlargement, no tenderness.  LUNGS: Normal breath sounds bilaterally, no wheezing, rales,rhonchi or crepitation. No use of accessory muscles of respiration.  CARDIOVASCULAR: S1, S2 [id number].3/2 systolic murmur present, no rubs or gallops.  ABDOMEN: Soft, nontender, nondistended. Bowel sounds present. No organomegaly or mass.  EXTREMITIES: 2+ pedal edema , improved since admission, no cyanosis, or clubbing.  NEUROLOGIC: Cranial nerves  II through XII are intact. Muscle strength 5/5 in all extremities. Sensation intact. Gait not checked.  PSYCHIATRIC: The patient is alert and oriented x 3.  SKIN: No obvious rash, lesion, or ulcer.   DATA REVIEW:   CBC  Recent Labs Lab 01/21/15 0113  WBC 6.7  HGB 13.5  HCT 39.7*  PLT 126*    Chemistries   Recent Labs Lab 01/23/15 0437  NA 141  K 4.1  CL 103  CO2 32  GLUCOSE 118*  BUN 26*  CREATININE 1.33*  CALCIUM 9.0    Cardiac Enzymes  Recent Labs Lab 01/21/15 0113  TROPONINI 0.48*    Microbiology Results  Results for orders placed or performed in visit on 01/23/14  Culture, blood (single)     Status: None   Collection Time: 01/23/14 11:54 PM  Result Value Ref Range Status   Micro Text Report   Final       COMMENT                   NO  GROWTH AEROBICALLY/ANAEROBICALLY IN 5 DAYS   ANTIBIOTIC                                                      Culture, blood (single)     Status: None   Collection Time: 01/23/19 11:49 PM  Result Value Ref Range Status   Micro Text Report   Final       COMMENT                   NO GROWTH AEROBICALLY/ANAEROBICALLY IN 5 DAYS   ANTIBIOTIC                                                        RADIOLOGY:  US Venous Img Lower Bilateral  01/21/2015   CLINICAL DATA:  Bilateral lower extremity pain and swelling for 3 weeks. Evaluate for DVT.  EXAM: BILATERAL LOWER EXTREMITY VENOUS DOPPLER ULTRASOUND  TECHNIQUE: Gray-scale sonography with graded compression, as well as color Doppler and duplex ultrasound were performed to evaluate the lower extremity deep venous systems from the level of the common femoral vein and including the common femoral, femoral, profunda femoral, popliteal and calf veins including the posterior tibial, peroneal and gastrocnemius veins when visible. The superficial great saphenous vein was also interrogated. Spectral Doppler was utilized to evaluate flow at rest and with distal augmentation maneuvers in the common femoral, femoral and popliteal veins.  COMPARISON:  None.  FINDINGS: RIGHT LOWER EXTREMITY  Common Femoral Vein: No evidence of thrombus. Normal compressibility, respiratory phasicity and response to augmentation.  Saphenofemoral Junction: No evidence of thrombus. Normal compressibility and flow on color Doppler imaging.  Profunda Femoral Vein: No evidence of thrombus. Normal compressibility and flow on color Doppler imaging.  Femoral Vein: No evidence of thrombus. Normal compressibility, respiratory phasicity and response to augmentation.  Popliteal Vein: No evidence of thrombus. Normal compressibility, respiratory phasicity and response to augmentation.  Calf Veins: No evidence of thrombus. Normal compressibility and flow on color Doppler imaging.  Superficial Great  Saphenous Vein: No evidence of thrombus. Normal compressibility and flow on color Doppler imaging.  Venous Reflux:  None.  Other Findings: Subcutaneous edema is noted at the level of the right ankle (image 29).  LEFT LOWER EXTREMITY  Common Femoral Vein: No evidence of thrombus. Normal compressibility, respiratory phasicity and response to augmentation.  Saphenofemoral Junction: No evidence of thrombus. Normal compressibility and flow on color Doppler imaging.  Profunda Femoral Vein: No evidence of thrombus. Normal compressibility and flow on color Doppler imaging.  Femoral Vein: No evidence of thrombus. Normal compressibility, respiratory phasicity and response to augmentation.  Popliteal Vein: No evidence of thrombus. Normal compressibility, respiratory phasicity and response to augmentation.  Calf Veins: No evidence of thrombus. Normal compressibility and flow on color Doppler imaging.  Superficial Great Saphenous Vein: No evidence of thrombus. Normal compressibility and flow on color Doppler imaging.  Venous Reflux:  None.  Other Findings: A minimal amount of subcutaneous edema is noted at the level of the left lower leg (image 61).  IMPRESSION: No evidence of DVT within either lower extremity.   Electronically Signed   By: Sandi Mariscal M.D.   On: 01/21/2015 22:04    EKG:   Orders placed or performed during the hospital encounter of 01/20/15  . ED EKG  (if patient has PMH of COPD)  . ED EKG  (if patient has PMH of COPD)  . EKG 12-Lead  . EKG 12-Lead      Management plans discussed with the patient, family and they are in agreement.  CODE STATUS:     Code Status Orders        Start     Ordered   01/20/15 1317  Full code   Continuous     01/20/15 1316      TOTAL TIME TAKING CARE OF THIS PATIENT: 38 minutes.    Gladstone Lighter M.D on 01/23/2015 at 10:02 AM  Between 7am to 6pm - Pager - [phone]  After 6pm go to [email or web address] - password [email or web address] [facility]  [facility]   Office  [phone]  CC: Primary care physician; No PCP Per Patient

## 2015-01-23 NOTE — Discharge Instructions (Signed)

## 2015-01-23 NOTE — Care Management (Signed)
Patient for discharge today.  Says he is going back to the boarding house and will move in with his daughter Vernie Murders  later in the week.  REferral called and faxed to [facility] for nursing

## 2015-01-23 NOTE — Progress Notes (Signed)
Discharge instructions explained to pt/ verbalized an understanding / iv and tele removed/ transported off unit via wheelchair.  

## 2015-01-23 NOTE — Progress Notes (Signed)
Pt has low HR -Kalisetti   MD order to hold metoprolol

## 2015-01-23 NOTE — Progress Notes (Signed)
[place] at [place] NAME: Dillon [name]    MR#:  [id number]  DATE OF BIRTH:  [birth year]  SUBJECTIVE:  CHIEF COMPLAINT:   Chief Complaint  Patient presents with  . Groin Swelling   Lower extremity edema is improving. Echocardiogram with EF of 25%. Diuresing well. Lost about 6 pounds since admission. Awaiting to be discharged today.  REVIEW OF SYSTEMS:  ROS Constitutional: Negative for fever and chills.  Respiratory: Improved breathing.  Negative for cough and wheezing.  Cardiovascular: No orthopnea present and positive for leg swelling. Negative for chest pain and palpitations.  Gastrointestinal: Negative for nausea, vomiting, abdominal pain, diarrhea and constipation.  Genitourinary: Negative for dysuria.  Neurological: Negative for dizziness, seizures and headaches.  DRUG ALLERGIES:  No Known Allergies  VITALS:  Blood pressure 144/76, pulse 56, temperature 98.3 F (36.8 C), temperature source Oral, resp. rate 20, height 5\' 9"  (1.753 m), weight 98.236 kg (216 lb 9.1 oz), SpO2 100 %.  PHYSICAL EXAMINATION:  Physical Exam  GENERAL: 69 y.o.-year-old patient lying in the bed with no acute distress.  EYES: Pupils equal, round, reactive to light and accommodation.Right pupil is opaque possible cataracts. No scleral icterus. Extraocular muscles intact.  HEENT: Head atraumatic, normocephalic. Oropharynx and nasopharynx clear.  NECK: Supple, no jugular venous distention. No thyroid enlargement, no tenderness.  LUNGS: Normal breath sounds bilaterally, no wheezing, rales,rhonchi or crepitation. No use of accessory muscles of respiration.   CARDIOVASCULAR: S1, S2 MHDQQI.2/9 systolic murmur present, no rubs or gallops.  ABDOMEN: Soft, nontender, nondistended. Bowel sounds present. No organomegaly or mass.  EXTREMITIES: 2+ pedal edema , approved since admission, no cyanosis, or clubbing.  NEUROLOGIC: Cranial nerves II through  XII are intact. Muscle strength 5/5 in all extremities. Sensation intact. Gait not checked.  PSYCHIATRIC: The patient is alert and oriented x 3.  SKIN: No obvious rash, lesion, or ulcer.   LABORATORY PANEL:   CBC  Recent Labs Lab 01/21/15 0113  WBC 6.7  HGB 13.5  HCT 39.7*  PLT 126*   ------------------------------------------------------------------------------------------------------------------  Chemistries   Recent Labs Lab 01/23/15 0437  NA 141  K 4.1  CL 103  CO2 32  GLUCOSE 118*  BUN 26*  CREATININE 1.33*  CALCIUM 9.0   ------------------------------------------------------------------------------------------------------------------  Cardiac Enzymes  Recent Labs Lab 01/21/15 0113  TROPONINI 0.48*   ------------------------------------------------------------------------------------------------------------------  RADIOLOGY:  US Venous Img Lower Bilateral  01/21/2015   CLINICAL DATA:  Bilateral lower extremity pain and swelling for 3 weeks. Evaluate for DVT.  EXAM: BILATERAL LOWER EXTREMITY VENOUS DOPPLER ULTRASOUND  TECHNIQUE: Gray-scale sonography with graded compression, as well as color Doppler and duplex ultrasound were performed to evaluate the lower extremity deep venous systems from the level of the common femoral vein and including the common femoral, femoral, profunda femoral, popliteal and calf veins including the posterior tibial, peroneal and gastrocnemius veins when visible. The superficial great saphenous vein was also interrogated. Spectral Doppler was utilized to evaluate flow at rest and with distal augmentation maneuvers in the common femoral, femoral and popliteal veins.  COMPARISON:  None.  FINDINGS: RIGHT LOWER EXTREMITY  Common Femoral Vein: No evidence of thrombus. Normal compressibility, respiratory phasicity and response to augmentation.  Saphenofemoral Junction: No evidence of thrombus. Normal compressibility and flow on color Doppler  imaging.  Profunda Femoral Vein: No evidence of thrombus. Normal compressibility and flow on color Doppler imaging.  Femoral Vein: No evidence of thrombus. Normal compressibility, respiratory phasicity and response to augmentation.  Popliteal Vein: No evidence of thrombus. Normal compressibility, respiratory phasicity and response to augmentation.  Calf Veins: No evidence of thrombus. Normal compressibility and flow on color Doppler imaging.  Superficial Great Saphenous Vein: No evidence of thrombus. Normal compressibility and flow on color Doppler imaging.  Venous Reflux:  None.  Other Findings: Subcutaneous edema is noted at the level of the right ankle (image 29).  LEFT LOWER EXTREMITY  Common Femoral Vein: No evidence of thrombus. Normal compressibility, respiratory phasicity and response to augmentation.  Saphenofemoral Junction: No evidence of thrombus. Normal compressibility and flow on color Doppler imaging.  Profunda Femoral Vein: No evidence of thrombus. Normal compressibility and flow on color Doppler imaging.  Femoral Vein: No evidence of thrombus. Normal compressibility, respiratory phasicity and response to augmentation.  Popliteal Vein: No evidence of thrombus. Normal compressibility, respiratory phasicity and response to augmentation.  Calf Veins: No evidence of thrombus. Normal compressibility and flow on color Doppler imaging.  Superficial Great Saphenous Vein: No evidence of thrombus. Normal compressibility and flow on color Doppler imaging.  Venous Reflux:  None.  Other Findings: A minimal amount of subcutaneous edema is noted at the level of the left lower leg (image 61).  IMPRESSION: No evidence of DVT within either lower extremity.   Electronically Signed   By: Sandi Mariscal M.D.   On: 01/21/2015 22:04    EKG:   Orders placed or performed during the hospital encounter of 01/20/15  . ED EKG  (if patient has PMH of COPD)  . ED EKG  (if patient has PMH of COPD)  . EKG 12-Lead  . EKG  12-Lead    ASSESSMENT AND PLAN:   Dillon [name] is a 69 y.o. male with a known history of CAD status post bypass graft surgery, systolic congestive heart failure last known ejection fraction of 25%, hypertension and hyperlipidemia presents to the hospital secondary to worsening lower extremity edema and also worsening dyspnea.  #1 Acute on chronic systolic CHF exacerbation-known CHF with last EF of 25% in August 2015 from outpatient echo. Worsening lower extremity edema and also dyspnea.  -Appreciate cardiology consult. Repeat echo with-  Systolic function was severely reduced. The estimated ejection fraction was in the range of 25% to 30%. Akinesis of the apical myocardium. - Regional wall motion abnormality: Akinesis of the apical septal and apical myocardium. On Lasix IV 3 times a day. Monitor renal function closely. -Lower extremity Dopplers negative for any DVT.  -Continue metoprolol, lisinopril and statin. Strict I's and O's monitoring-negative by 6 L since admission, weight checks. -Started on Aldactone. Radene Knee to by mouth Lasix twice a day and also add metolazone or HCTZ when necessary at discharge  #2. CAD status post bypass graft surgery-stable at this time. Continue cardiac medications. Denies any chest pain. Started on aspirin. Also on Imdur, metoprolol, lisinopril and statin. Troponins are elevated but stable.  #3 Hypertension-continue home medications.  #4 hypokalemia-due to being on Lasix. on potassium supplements. Continue to monitor. Stable potassium  #5 Hyperlipidemia- started on statin. LDL is 120  #6 DVT prophylaxis-Lovenox subcutaneously.  #7 Gastroesophageal reflux disease-patient complaining of heartburn and reflux symptoms lately. on Protonix daily. Also Maalox when necessary.   All the records are reviewed and case discussed with Care Management/Social Workerr. Management plans discussed with the patient, family and they are in agreement.  CODE  STATUS: Full code  TOTAL TIME TAKING CARE OF THIS PATIENT: 37 minutes.   POSSIBLE D/C TODAY, DEPENDING ON CLINICAL CONDITION.   Kandis Nab.D  on 01/23/2015 at 9:48 AM  Between 7am to 6pm - Pager - [phone]  After 6pm go to [email or web address] - password [email or web address] [facility]  [facility]  Office  [phone]  CC: Primary care physician; No PCP Per Patient

## 2015-02-08 ENCOUNTER — Ambulatory Visit: Payer: Medicare Other | Admitting: Family

## 2015-02-08 ENCOUNTER — Encounter: Payer: Self-pay | Admitting: Family

## 2015-02-08 VITALS — BP 131/64 | HR 66 | Resp 20 | Ht 69.0 in | Wt 208.0 lb

## 2015-02-08 DIAGNOSIS — I071 Rheumatic tricuspid insufficiency: Secondary | ICD-10-CM | POA: Diagnosis present

## 2015-02-08 DIAGNOSIS — Z951 Presence of aortocoronary bypass graft: Secondary | ICD-10-CM

## 2015-02-08 DIAGNOSIS — K219 Gastro-esophageal reflux disease without esophagitis: Secondary | ICD-10-CM | POA: Diagnosis present

## 2015-02-08 DIAGNOSIS — R471 Dysarthria and anarthria: Secondary | ICD-10-CM | POA: Diagnosis present

## 2015-02-08 DIAGNOSIS — H5441 Blindness, right eye, normal vision left eye: Secondary | ICD-10-CM | POA: Diagnosis present

## 2015-02-08 DIAGNOSIS — Z7982 Long term (current) use of aspirin: Secondary | ICD-10-CM

## 2015-02-08 DIAGNOSIS — I5022 Chronic systolic (congestive) heart failure: Secondary | ICD-10-CM | POA: Insufficient documentation

## 2015-02-08 DIAGNOSIS — I639 Cerebral infarction, unspecified: Principal | ICD-10-CM | POA: Diagnosis present

## 2015-02-08 DIAGNOSIS — L8990 Pressure ulcer of unspecified site, unspecified stage: Secondary | ICD-10-CM | POA: Diagnosis present

## 2015-02-08 DIAGNOSIS — Z79899 Other long term (current) drug therapy: Secondary | ICD-10-CM

## 2015-02-08 DIAGNOSIS — Z87891 Personal history of nicotine dependence: Secondary | ICD-10-CM

## 2015-02-08 DIAGNOSIS — I10 Essential (primary) hypertension: Secondary | ICD-10-CM | POA: Diagnosis present

## 2015-02-08 DIAGNOSIS — I6502 Occlusion and stenosis of left vertebral artery: Secondary | ICD-10-CM | POA: Diagnosis present

## 2015-02-08 DIAGNOSIS — I2510 Atherosclerotic heart disease of native coronary artery without angina pectoris: Secondary | ICD-10-CM | POA: Diagnosis present

## 2015-02-08 DIAGNOSIS — E785 Hyperlipidemia, unspecified: Secondary | ICD-10-CM | POA: Diagnosis present

## 2015-02-08 DIAGNOSIS — I255 Ischemic cardiomyopathy: Secondary | ICD-10-CM | POA: Diagnosis present

## 2015-02-08 DIAGNOSIS — R4701 Aphasia: Secondary | ICD-10-CM | POA: Diagnosis present

## 2015-02-08 DIAGNOSIS — I252 Old myocardial infarction: Secondary | ICD-10-CM

## 2015-02-08 DIAGNOSIS — G8191 Hemiplegia, unspecified affecting right dominant side: Secondary | ICD-10-CM | POA: Diagnosis present

## 2015-02-08 DIAGNOSIS — Z8249 Family history of ischemic heart disease and other diseases of the circulatory system: Secondary | ICD-10-CM

## 2015-02-08 DIAGNOSIS — I6522 Occlusion and stenosis of left carotid artery: Secondary | ICD-10-CM | POA: Diagnosis present

## 2015-02-08 MED ORDER — NITROGLYCERIN 0.4 MG SL SUBL: 0.4000 mg | SUBLINGUAL_TABLET | SUBLINGUAL | Status: AC | PRN

## 2015-02-08 NOTE — Progress Notes (Signed)
Subjective:    Patient ID: Dillon [name], male    DOB: 12-07-1945, 69 y.o.   MRN: [id number]  Congestive Heart Failure Presents for initial visit. The disease course has been improving. Associated symptoms include edema, fatigue, muscle weakness (both legs), palpitations and shortness of breath. Pertinent negatives include no abdominal pain, chest pain, orthopnea or paroxysmal nocturnal dyspnea. The symptoms have been improving. Past treatments include ACE inhibitors, beta blockers, salt and fluid restriction and aldosterone receptor blockers. The treatment provided moderate relief. Compliance with prior treatments has been good. His past medical history is significant for CAD (multiple heart attacks) and HTN.  Shortness of Breath This is a chronic problem. The current episode started more than 1 year ago. The problem occurs daily. The problem has been gradually improving. Pertinent negatives include no abdominal pain, chest pain, headaches, leg pain, leg swelling, neck pain, sore throat or wheezing. The symptoms are aggravated by any activity. His past medical history is significant for CAD (multiple heart attacks).   No Known Allergies Prior to Admission medications   Medication Sig Start Date End Date Taking? Authorizing Provider  aspirin EC 81 MG EC tablet Take 1 tablet (81 mg total) by mouth daily. 01/23/15  Yes Gladstone Lighter, MD  atorvastatin (LIPITOR) 40 MG tablet Take 1 tablet (40 mg total) by mouth daily at 6 PM. 01/23/15  Yes Gladstone Lighter, MD  furosemide (LASIX) 40 MG tablet Take 1 tablet (40 mg total) by mouth 2 (two) times daily. 01/23/15  Yes Gladstone Lighter, MD  isosorbide mononitrate (IMDUR) 30 MG 24 hr tablet Take 1 tablet (30 mg total) by mouth daily. 01/23/15  Yes Gladstone Lighter, MD  lisinopril (PRINIVIL,ZESTRIL) 40 MG tablet Take 40 mg by mouth daily.   Yes Historical Provider, MD  metolazone (ZAROXOLYN) 2.5 MG tablet Take 1 tablet (2.5 mg total) by mouth every  other day as needed. 01/23/15  Yes Gladstone Lighter, MD  metoprolol tartrate (LOPRESSOR) 25 MG tablet Take 25 mg by mouth 2 (two) times daily.   Yes Historical Provider, MD  nitroGLYCERIN (NITROSTAT) 0.4 MG SL tablet Place 1 tablet (0.4 mg total) under the tongue every 5 (five) minutes as needed. For chest pain. May take up to 3 doses. 02/08/15  Yes Alisa Graff, FNP  pantoprazole (PROTONIX) 40 MG tablet Take 1 tablet (40 mg total) by mouth daily. 01/23/15  Yes Gladstone Lighter, MD  Potassium Chloride ER 20 MEQ TBCR Take 20 mEq by mouth daily. 01/23/15  Yes Gladstone Lighter, MD  spironolactone (ALDACTONE) 25 MG tablet Take 1 tablet (25 mg total) by mouth daily. 01/23/15  Yes Gladstone Lighter, MD      Review of Systems  Constitutional: Positive for fatigue. Negative for appetite change.  HENT: Negative for sore throat and trouble swallowing.   Eyes: Negative.   Respiratory: Positive for cough (dry cough) and shortness of breath. Negative for wheezing.   Cardiovascular: Positive for palpitations. Negative for chest pain and leg swelling.  Gastrointestinal: Negative for abdominal pain and abdominal distention.  Endocrine: Negative.   Genitourinary: Negative.   Musculoskeletal: Positive for muscle weakness (both legs). Negative for back pain and neck pain.  Skin: Negative.   Allergic/Immunologic: Negative.   Neurological: Positive for dizziness and weakness. Negative for headaches.  Hematological: Negative for adenopathy. Does not bruise/bleed easily.  Psychiatric/Behavioral: Positive for sleep disturbance (sleeps on 2 pillows). The patient is not nervous/anxious.        Objective:   Physical Exam  Constitutional: He is  oriented to person, place, and time. He appears well-developed and well-nourished.  HENT:  Head: Normocephalic and atraumatic.  Eyes: Conjunctivae are normal. Pupils are equal, round, and reactive to light.  Neck: Normal range of motion. Neck supple.  Cardiovascular:  Normal rate and regular rhythm.   Pulmonary/Chest: Effort normal and breath sounds normal. He has no wheezes. He has no rales.  Abdominal: Soft. He exhibits no distension. There is no tenderness.  Musculoskeletal: He exhibits edema (trace amount bilateral lower legs). He exhibits no tenderness.  Neurological: He is alert and oriented to person, place, and time.  Skin: Skin is warm and dry. No erythema.  Psychiatric: He has a normal mood and affect. His behavior is normal.  Nursing note and vitals reviewed.  BP 131/64 mmHg  Pulse 66  Resp 20  Ht 5\' 9"  (1.753 m)  Wt 208 lb (94.348 kg)  BMI 30.70 kg/m2  SpO2 94%     Assessment & Plan:  1: Chronic heart failure with reduced ejection fraction- Patient presents with bilateral leg weakness. He denies any shortness of breath or fatigue just that his legs feel weak. He does use a cane for walking and says that he hasn't fallen in the last month. He weighs himself daily and says that his weight has been stable. Reminded to call for an overnight weight gain of >2 pounds or a weekly weight gain of >5 pounds. He does have metolazone that he can take as needed. He is not adding any salt to his food and uses pepper instead. He and his family say that they have been reading food labels and written information was provided to them. Discussed possibly increasing his metoprolol and or changing his lisinopril to entresto. He already wears TED stockings daily and tries to elevate his legs when he's at home. Patient says that he's had a dry cough which could be related to his lisinopril.  2: Hyperlipidemia- Patient is already taking atorvastatin for his cholesterol. 3: CAD- Patient says that he's had 6 heart attacks in the past. He says that he hasn't used his nitroglycerin in a long time but would like a new prescription. Refill provided today.  4: Speech difficulty- Patient and son-in-law say that in the last couple of weeks, patient has had difficulty forming  words. Patient finds this frustrating and he gets teary eyed when trying to form his words. They think it's been since his hospitalization and were wondering if this could be related to his medications. Discussed that it could be but that if it doesn't start to improve over the next couple of weeks, to call his cardiologist as he may benefit from a carotid ultrasound to make sure there's not blockages.   Followup in one month or sooner for any questions/problems before then.

## 2015-02-08 NOTE — Patient Instructions (Signed)
Continue weighing daily and call for an overnight weight gain of >2 pounds or a weekly weight gain of >5 pounds.   Continue medications at this time.  If difficulty with speech does not improve, follow up sooner with cardiologist.

## 2015-02-11 ENCOUNTER — Emergency Department: Payer: Medicare Other

## 2015-02-11 ENCOUNTER — Inpatient Hospital Stay
Admit: 2015-02-11 | Discharge: 2015-02-11 | Disposition: A | Discharge status: Home / Self Care | Payer: Medicare Other | Attending: Internal Medicine | Admitting: Internal Medicine

## 2015-02-11 ENCOUNTER — Encounter: Payer: Self-pay | Admitting: Emergency Medicine

## 2015-02-11 ENCOUNTER — Inpatient Hospital Stay: Payer: Medicare Other

## 2015-02-11 ENCOUNTER — Other Ambulatory Visit: Payer: Self-pay

## 2015-02-11 ENCOUNTER — Inpatient Hospital Stay
Admission: EM | Admit: 2015-02-11 | Discharge: 2015-02-13 | DRG: 065 | Disposition: A | Discharge status: Home / Self Care | Payer: Medicare Other | Attending: Internal Medicine | Admitting: Internal Medicine

## 2015-02-11 DIAGNOSIS — Z87891 Personal history of nicotine dependence: Secondary | ICD-10-CM | POA: Diagnosis not present

## 2015-02-11 DIAGNOSIS — G8191 Hemiplegia, unspecified affecting right dominant side: Secondary | ICD-10-CM

## 2015-02-11 DIAGNOSIS — L8990 Pressure ulcer of unspecified site, unspecified stage: Secondary | ICD-10-CM

## 2015-02-11 DIAGNOSIS — Z79899 Other long term (current) drug therapy: Secondary | ICD-10-CM | POA: Diagnosis not present

## 2015-02-11 DIAGNOSIS — I6529 Occlusion and stenosis of unspecified carotid artery: Secondary | ICD-10-CM

## 2015-02-11 DIAGNOSIS — E785 Hyperlipidemia, unspecified: Secondary | ICD-10-CM | POA: Diagnosis present

## 2015-02-11 DIAGNOSIS — I2510 Atherosclerotic heart disease of native coronary artery without angina pectoris: Secondary | ICD-10-CM | POA: Diagnosis present

## 2015-02-11 DIAGNOSIS — R4701 Aphasia: Secondary | ICD-10-CM | POA: Diagnosis present

## 2015-02-11 DIAGNOSIS — I071 Rheumatic tricuspid insufficiency: Secondary | ICD-10-CM | POA: Diagnosis present

## 2015-02-11 DIAGNOSIS — I6509 Occlusion and stenosis of unspecified vertebral artery: Secondary | ICD-10-CM

## 2015-02-11 DIAGNOSIS — Z8249 Family history of ischemic heart disease and other diseases of the circulatory system: Secondary | ICD-10-CM | POA: Diagnosis not present

## 2015-02-11 DIAGNOSIS — R471 Dysarthria and anarthria: Secondary | ICD-10-CM | POA: Diagnosis present

## 2015-02-11 DIAGNOSIS — I5022 Chronic systolic (congestive) heart failure: Secondary | ICD-10-CM | POA: Diagnosis present

## 2015-02-11 DIAGNOSIS — G8190 Hemiplegia, unspecified affecting unspecified side: Secondary | ICD-10-CM | POA: Diagnosis not present

## 2015-02-11 DIAGNOSIS — I10 Essential (primary) hypertension: Secondary | ICD-10-CM | POA: Diagnosis present

## 2015-02-11 DIAGNOSIS — I6522 Occlusion and stenosis of left carotid artery: Secondary | ICD-10-CM | POA: Diagnosis present

## 2015-02-11 DIAGNOSIS — H5441 Blindness, right eye, normal vision left eye: Secondary | ICD-10-CM | POA: Diagnosis present

## 2015-02-11 DIAGNOSIS — Z951 Presence of aortocoronary bypass graft: Secondary | ICD-10-CM | POA: Diagnosis not present

## 2015-02-11 DIAGNOSIS — I255 Ischemic cardiomyopathy: Secondary | ICD-10-CM | POA: Diagnosis present

## 2015-02-11 DIAGNOSIS — K219 Gastro-esophageal reflux disease without esophagitis: Secondary | ICD-10-CM | POA: Diagnosis present

## 2015-02-11 DIAGNOSIS — Z7982 Long term (current) use of aspirin: Secondary | ICD-10-CM | POA: Diagnosis not present

## 2015-02-11 DIAGNOSIS — I639 Cerebral infarction, unspecified: Secondary | ICD-10-CM | POA: Diagnosis present

## 2015-02-11 DIAGNOSIS — I6502 Occlusion and stenosis of left vertebral artery: Secondary | ICD-10-CM | POA: Diagnosis present

## 2015-02-11 DIAGNOSIS — I252 Old myocardial infarction: Secondary | ICD-10-CM | POA: Diagnosis not present

## 2015-02-11 LAB — URINALYSIS COMPLETE WITH MICROSCOPIC (ARMC ONLY)
Bacteria, UA: NONE SEEN
Bilirubin Urine: NEGATIVE
Glucose, UA: NEGATIVE mg/dL
Hgb urine dipstick: NEGATIVE
KETONES UR: NEGATIVE mg/dL
Leukocytes, UA: NEGATIVE
Nitrite: NEGATIVE
PROTEIN: NEGATIVE mg/dL
RBC / HPF: NONE SEEN RBC/hpf (ref 0–5)
Specific Gravity, Urine: 1.008 (ref 1.005–1.030)
pH: 5 (ref 5.0–8.0)

## 2015-02-11 LAB — COMPREHENSIVE METABOLIC PANEL
ALK PHOS: 131 U/L — AB (ref 38–126)
ALT: 16 U/L — ABNORMAL LOW (ref 17–63)
ANION GAP: 7 (ref 5–15)
AST: 27 U/L (ref 15–41)
Albumin: 3.8 g/dL (ref 3.5–5.0)
BILIRUBIN TOTAL: 1.1 mg/dL (ref 0.3–1.2)
BUN: 16 mg/dL (ref 6–20)
CHLORIDE: 108 mmol/L (ref 101–111)
CO2: 26 mmol/L (ref 22–32)
CREATININE: 0.96 mg/dL (ref 0.61–1.24)
Calcium: 9.5 mg/dL (ref 8.9–10.3)
GFR calc Af Amer: >60 mL/min (ref >60)
Glucose, Bld: 121 mg/dL — ABNORMAL HIGH (ref 65–99)
Potassium: 4.2 mmol/L (ref 3.5–5.1)
Sodium: 141 mmol/L (ref 135–145)
Total Protein: 7.3 g/dL (ref 6.5–8.1)

## 2015-02-11 LAB — CBC
HCT: 42.6 % (ref 40.0–52.0)
Hemoglobin: 14.1 g/dL (ref 13.0–18.0)
MCH: 28.9 pg (ref 26.0–34.0)
MCHC: 33.1 g/dL (ref 32.0–36.0)
MCV: 87.3 fL (ref 80.0–100.0)
Platelets: 125 10*3/uL — ABNORMAL LOW (ref 150–440)
RBC: 4.87 MIL/uL (ref 4.40–5.90)
RDW: 14.7 % — ABNORMAL HIGH (ref 11.5–14.5)
WBC: 6.3 10*3/uL (ref 3.8–10.6)

## 2015-02-11 LAB — TSH: TSH: 3.185 u[IU]/mL (ref 0.350–4.500)

## 2015-02-11 MED ORDER — PANTOPRAZOLE SODIUM 40 MG PO TBEC
40.0000 mg | DELAYED_RELEASE_TABLET | Freq: Every day | ORAL | Status: DC
Start: 2015-02-11 — End: 2015-02-13
  Administered 2015-02-11 – 2015-02-13 (×3): 40 mg via ORAL
  Filled 2015-02-11 (×3): qty 1

## 2015-02-11 MED ORDER — SPIRONOLACTONE 25 MG PO TABS
25.0000 mg | ORAL_TABLET | Freq: Every day | ORAL | Status: DC
  Administered 2015-02-13: 25 mg via ORAL
  Filled 2015-02-11 (×2): qty 1

## 2015-02-11 MED ORDER — FUROSEMIDE 40 MG PO TABS
40.0000 mg | ORAL_TABLET | Freq: Two times a day (BID) | ORAL | Status: DC
  Administered 2015-02-12 – 2015-02-13 (×3): 40 mg via ORAL
  Filled 2015-02-11 (×4): qty 1

## 2015-02-11 MED ORDER — ACETAMINOPHEN 650 MG RE SUPP: 650.0000 mg | Freq: Four times a day (QID) | RECTAL | Status: DC | PRN

## 2015-02-11 MED ORDER — ASPIRIN 81 MG PO CHEW
243.0000 mg | CHEWABLE_TABLET | Freq: Once | ORAL | Status: AC
  Administered 2015-02-11: 243 mg via ORAL

## 2015-02-11 MED ORDER — ATORVASTATIN CALCIUM 20 MG PO TABS
40.0000 mg | ORAL_TABLET | Freq: Every day | ORAL | Status: DC
  Administered 2015-02-11 – 2015-02-12 (×2): 40 mg via ORAL
  Filled 2015-02-11 (×2): qty 2

## 2015-02-11 MED ORDER — ASPIRIN EC 325 MG PO TBEC: 325.0000 mg | DELAYED_RELEASE_TABLET | Freq: Every day | ORAL | Status: DC

## 2015-02-11 MED ORDER — SODIUM CHLORIDE 0.9 % IJ SOLN: 3.0000 mL | INTRAMUSCULAR | Status: DC | PRN

## 2015-02-11 MED ORDER — ASPIRIN 81 MG PO CHEW: 324.0000 mg | CHEWABLE_TABLET | Freq: Once | ORAL | Status: DC

## 2015-02-11 MED ORDER — METOPROLOL TARTRATE 25 MG PO TABS
25.0000 mg | ORAL_TABLET | Freq: Two times a day (BID) | ORAL | Status: DC
  Administered 2015-02-11 – 2015-02-13 (×3): 25 mg via ORAL
  Filled 2015-02-11 (×4): qty 1

## 2015-02-11 MED ORDER — ASPIRIN 81 MG PO CHEW
CHEWABLE_TABLET | ORAL | Status: AC
  Administered 2015-02-11: 243 mg via ORAL
  Filled 2015-02-11: qty 4

## 2015-02-11 MED ORDER — DOCUSATE SODIUM 100 MG PO CAPS
100.0000 mg | ORAL_CAPSULE | Freq: Two times a day (BID) | ORAL | Status: DC
  Administered 2015-02-11 – 2015-02-13 (×3): 100 mg via ORAL
  Filled 2015-02-11 (×4): qty 1

## 2015-02-11 MED ORDER — SODIUM CHLORIDE 0.9 % IJ SOLN
3.0000 mL | Freq: Two times a day (BID) | INTRAMUSCULAR | Status: DC
  Administered 2015-02-11 – 2015-02-13 (×5): 3 mL via INTRAVENOUS

## 2015-02-11 MED ORDER — LISINOPRIL 20 MG PO TABS
20.0000 mg | ORAL_TABLET | Freq: Every day | ORAL | Status: DC
  Administered 2015-02-12 – 2015-02-13 (×2): 20 mg via ORAL
  Filled 2015-02-11 (×2): qty 1

## 2015-02-11 MED ORDER — HEPARIN SODIUM (PORCINE) 5000 UNIT/ML IJ SOLN
5000.0000 [IU] | Freq: Three times a day (TID) | INTRAMUSCULAR | Status: DC
  Administered 2015-02-11 – 2015-02-13 (×6): 5000 [IU] via SUBCUTANEOUS
  Filled 2015-02-11 (×6): qty 1

## 2015-02-11 MED ORDER — SODIUM CHLORIDE 0.9 % IV SOLN
250.0000 mL | INTRAVENOUS | Status: DC | PRN
Start: 2015-02-11 — End: 2015-02-13

## 2015-02-11 MED ORDER — POTASSIUM CHLORIDE CRYS ER 20 MEQ PO TBCR
20.0000 meq | EXTENDED_RELEASE_TABLET | Freq: Every day | ORAL | Status: DC
  Administered 2015-02-11 – 2015-02-13 (×3): 20 meq via ORAL
  Filled 2015-02-11 (×3): qty 1

## 2015-02-11 MED ORDER — ACETAMINOPHEN 325 MG PO TABS: 650.0000 mg | ORAL_TABLET | Freq: Four times a day (QID) | ORAL | Status: DC | PRN

## 2015-02-11 NOTE — Plan of Care (Signed)
Problem: Discharge Progression Outcomes Goal: Tolerating diet Outcome: Progressing Patient admitted 02/11/15 presented to ED with right sided weakness and numbness for 4 days now with generalized increased weakness.  Head CT did show left lunar infarct, passed swallow study in ER.  VSS.  Alert and oriented.  Patient lives at home with Daughter and was receiving home health services but doesn't remember which company.  Buttocks red with no broken down areas present, pink foam applied.

## 2015-02-11 NOTE — Progress Notes (Signed)
*  PRELIMINARY RESULTS* Echocardiogram 2D Echocardiogram has been performed.  Dillon [name] 02/11/2015, 5:47 PM

## 2015-02-11 NOTE — ED Notes (Signed)
Pt from home , with increased weakness and confusion x5 day , was seen and admitted her approx 1 month ago with medication changes.

## 2015-02-11 NOTE — Care Management CHF Note (Signed)
Patient is readmitted from home.  Is followed by [facility] home care [facility] PT and informed that agency was trying to obtain order for social work.  Admitted with sx concenring for cva. Agency informed of admission

## 2015-02-11 NOTE — ED Notes (Signed)
MD at bedside. 

## 2015-02-11 NOTE — H&P (Signed)
[facility] [place] NAME: Dillon [name]    MR#:  [id number]  DATE OF BIRTH:  [birth year]  DATE OF ADMISSION:  02/11/2015  PRIMARY CARE PHYSICIAN: No PCP Per Patient   REQUESTING/REFERRING PHYSICIAN: DR Clearnce Hasten  CHIEF COMPLAINT:   Chief Complaint  Patient presents with  . Weakness  . Altered Mental Status    HISTORY OF PRESENT ILLNESS: Dillon [name]  is a 69 y.o. male with a known history of medical problems including chronic systolic CHF coronary artery disease status post MI and status post coronary bypass grafting history of chronic dyspnea,  hypertension and hyperlipidemia who presents to the hospital with complaints of  4-5 day history of right-sided weakness. The patient's daughter was present databank and interviewed patient started having symptoms of such as headaches as well as slurry speech on Saturday 3 days ago. 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 This patient CT of head revealed left pons injury,  suspected stroke hospitalist services were contacted for admission.   PAST MEDICAL HISTORY:   Past Medical History  Diagnosis Date  . Acute MI   . Coronary artery disease   . CHF (congestive heart failure)   . Hypertension   . Hyperlipidemia     PAST SURGICAL HISTORY:  Past Surgical History  Procedure Laterality Date  . Cardiac catheterization    . Coronary artery bypass graft    . Thumb fusion      SOCIAL HISTORY:  History  Substance Use Topics  . Smoking status: Former Smoker    Types: Cigarettes    Quit date: 01/20/1995  . Smokeless tobacco: Not on file  . Alcohol Use: No    FAMILY HISTORY:  Family History  Problem Relation Age of Onset  . CAD Father   . CAD Sister      DRUG ALLERGIES: No Known Allergies  Review of Systems  Constitutional: Positive for malaise/fatigue. Negative for fever, chills and weight loss.  HENT: Positive for sore throat. Negative for congestion.   Eyes: Positive for blurred vision and double vision.  Respiratory: Positive for shortness of breath. Negative for cough, sputum production and wheezing.   Cardiovascular: Positive for palpitations. Negative for chest pain, orthopnea, leg swelling and PND.  Gastrointestinal: Negative for nausea, vomiting, abdominal pain, diarrhea, constipation, blood in stool and melena.  Genitourinary: Negative for dysuria, urgency, frequency and hematuria.  Musculoskeletal: Negative for falls.  Skin: Negative for rash.  Neurological: Positive for weakness and headaches. Negative for dizziness.  Psychiatric/Behavioral: Negative for depression and memory loss. The patient is not nervous/anxious.     MEDICATIONS AT HOME:  Prior to Admission medications   Medication Sig Start Date End Date Taking? Authorizing Provider  aspirin EC 81 MG EC tablet Take 1 tablet (81 mg total) by mouth daily. 01/23/15  Yes Gladstone Lighter, MD  atorvastatin (LIPITOR) 40 MG tablet Take 1 tablet (40 mg total) by mouth daily at 6 PM. Patient taking differently: Take 40 mg by mouth every evening.  01/23/15  Yes Gladstone Lighter, MD  furosemide (LASIX) 40 MG tablet Take 1 tablet (40 mg total) by mouth 2 (two) times daily. 01/23/15  Yes Gladstone Lighter, MD  isosorbide mononitrate (IMDUR) 30 MG 24 hr tablet Take 1  tablet (30 mg total) by mouth daily. 01/23/15  Yes Gladstone Lighter, MD  lisinopril (PRINIVIL,ZESTRIL) 40 MG tablet Take 40 mg by mouth daily.   Yes Historical Provider, MD  metoprolol tartrate (LOPRESSOR) 25 MG tablet Take 25 mg by mouth 2 (two) times daily.   Yes Historical Provider, MD  nitroGLYCERIN (NITROSTAT) 0.4 MG SL tablet Place 1 tablet (0.4 mg total) under the tongue every 5 (five) minutes as needed. For  chest pain. May take up to 3 doses. 02/08/15  Yes Alisa Graff, FNP  omeprazole (PRILOSEC) 20 MG capsule Take 20 mg by mouth daily.   Yes Historical Provider, MD  Potassium Chloride ER 20 MEQ TBCR Take 20 mEq by mouth daily. 01/23/15  Yes Gladstone Lighter, MD  spironolactone (ALDACTONE) 25 MG tablet Take 1 tablet (25 mg total) by mouth daily. 01/23/15  Yes Gladstone Lighter, MD      PHYSICAL EXAMINATION:   VITAL SIGNS: Blood pressure 128/77, pulse 57, temperature 98.3 F (36.8 C), temperature source Oral, resp. rate 22, height 5\' 9"  (1.753 m), weight 91.627 kg (202 lb), SpO2 94 %.  GENERAL:  69 y.o.-year-old patient lying in the bed with no acute distress. Comfortable, has difficulty expressing himself, he is also having difficulty hearing  EYES: Pupils equal, round, reactive to light and accommodation. No scleral icterus. Extraocular muscles intact.  HEENT: Head atraumatic, normocephalic. Oropharynx and nasopharynx clear. Tongue is deviated to the left NECK:  Supple, no jugular venous distention. No thyroid enlargement, no tenderness.  LUNGS: Diminished breath sounds bilaterally, no wheezing, rales,rhonchi or crepitation. No use of accessory muscles of respiration.  CARDIOVASCULAR: S1, S2 normal. No murmurs, rubs, or gallops.  ABDOMEN: Soft, nontender, nondistended. Bowel sounds present. No organomegaly or mass.  EXTREMITIES: 1-2+ ankle and pedal edema, no cyanosis, or clubbing. Dorsalis  pedis pulses palpable daily diminished especially on the left. Some left tibial tenderness NEUROLOGIC: Cranial nerves  revealed right facial weakness forehead speared.. Muscle strength 3/5 in the right upper extremity , 4 out of 5 in the right lower extremity 5 out of 5 in other extremities. Sensation grossly intact. Gait not checked. Tongue is deviated to the left. Patient has expressive aphasia with difficulty finding words. Hearing is impaired due to normal speech PSYCHIATRIC: The patient is alert and  oriented x 3.  SKIN: No obvious rash, lesion, or ulcer.   LABORATORY PANEL:   CBC  Recent Labs Lab 02/11/15 1014  WBC 6.3  HGB 14.1  HCT 42.6  PLT 125*  MCV 87.3  MCH 28.9  MCHC 33.1  RDW 14.7*   ------------------------------------------------------------------------------------------------------------------  Chemistries   Recent Labs Lab 02/11/15 1014  NA 141  K 4.2  CL 108  CO2 26  GLUCOSE 121*  BUN 16  CREATININE 0.96  CALCIUM 9.5  AST 27  ALT 16*  ALKPHOS 131*  BILITOT 1.1   ------------------------------------------------------------------------------------------------------------------  Cardiac Enzymes No results for input(s): TROPONINI in the last 168 hours. ------------------------------------------------------------------------------------------------------------------  RADIOLOGY: Dg Chest 1 View  02/11/2015   CLINICAL DATA:  Weakness, altered mental status  EXAM: CHEST  1 VIEW  COMPARISON:  01/20/2015  FINDINGS: Cardiomegaly is noted with slight progression from prior exam. Status post CABG. No acute infiltrate or pleural effusion. No pulmonary edema.  IMPRESSION: No active disease. Status post CABG. Cardiomegaly with progression from prior exam.   Electronically Signed   By: Lahoma Crocker M.D.   On: 02/11/2015 11:20   Ct Head Wo Contrast  02/11/2015   CLINICAL DATA:  Weakness,  right-sided, with slurred speech since Friday. Altered mental status.  EXAM: CT HEAD WITHOUT CONTRAST  TECHNIQUE: Contiguous axial images were obtained from the base of the skull through the vertex without intravenous contrast.  COMPARISON:  None.  FINDINGS: Skull and Sinuses:Negative for fracture or destructive process. The mastoids, middle ears, and imaged paranasal sinuses are clear.  Orbits: No acute abnormality.  Brain: Approximately 1 cm low-density in the left posterior pons. Given provided history this may reflect an acute to subacute infarct with corticospinal and bulbar  deficits. There is also a remote appearing lacunar infarct in the right caudate head. Cerebral volume is within normal limits for age. No evidence of cortical infarct. No hemorrhage, hydrocephalus, or mass lesion.  These results were called by telephone at the time of interpretation on 02/11/2015 at 10:49 am to Dr. Larae Grooms , who verbally acknowledged these results.  IMPRESSION: 1. Low-density in the left pons, suspect recent infarct given the history. MRI could confirm. 2. 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   Electronically Signed   By: Monte Fantasia M.D.   On: 02/11/2015 10:52    EKG: Orders placed or performed during the hospital encounter of 02/11/15  . ED EKG  . ED EKG  EKG done in the emergency room on 02/11/2015  revealed normal sinus rhythm at 66 bpm right bundle branch block T depressions in inferior leads  IMPRESSION AND PLAN:  Principal Problem:   Right hemiparesis Active Problems:   CVA (cerebral infarction)   HTN (hypertension)   CAD (coronary artery disease) 1. 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 We will obtain MRI of the brain,  carotid ultrasound and echocardiogram.  Patient will be evaluated by physical therapist and occupational therapist as well as speech therapist while patient is hospitalized, 2. Hypertension decreased lisinopril dose to 20 mg daily and dose, continue beta blockers for now 3. Hyperlipidemia get lipid panel and continue statin 4. Chronic systolic CHF,  seemed to be stable continue outpatient medications, echocardiogram as workup for stroke to rule out cardiac clot. 5 arrhythmias per patient's report,  patient will likely need a 30 day 16 day cardiac monitor to rule out atrial fibrillation.    All the records are reviewed and case discussed with ED provider. Management plans discussed with the patient,  family and they are in agreement.  CODE STATUS:  Full code  TOTAL TIME TAKING CARE OF THIS PATIENT: 55  minutes.    Theodoro Grist M.D on 02/11/2015 at 12:42 PM  Between 7am to 6pm - Pager - [phone] After 6pm go to [email or web address] - password [email or web address] [facility]  [facility]  Office  [phone]  CC: Primary care physician; No PCP Per Patient

## 2015-02-11 NOTE — Progress Notes (Signed)
PT Cancellation Note  Patient Details Name: Dillon [name] MRN: [id number] DOB: 1945-12-03   Cancelled Treatment:    Reason Eval/Treat Not Completed: Patient at procedure or test/unavailable (Chart reviewed and evaluation attempted.  Patient currently leaving unit for diagnostic testing.  Will re-attempt  next date as patient available and medically appropriate.)   Hagen Tidd H. Owens Shark, PT, DPT 02/11/2015, 4:04 PM [phone]

## 2015-02-11 NOTE — ED Provider Notes (Signed)
[facility] Emergency Department Provider Note  ____________________________________________  Time seen: Approximately 10:30 AM  I have reviewed the triage vital signs and the nursing notes.   HISTORY  Chief Complaint Weakness and Altered Mental Status    HPI Dillon [name] is a 69 y.o. male with a history of CHF and CAD with a recent admission for CHF who presents today with 4-5 days of worsening right-sided weakness and word finding difficulties. Patient's daughter is in the room with him and feels that this is secondary to recent medication additions since his last admission which the patient has stopped taking over the last 2 days. He says he has felt minimal improvement since not taking these medications. Patient says that he is also having difficulty walking because of "stumbling around." No exacerbating qualities to the symptoms.   Past Medical History  Diagnosis Date  . Acute MI   . Coronary artery disease   . CHF (congestive heart failure)   . Hypertension   . Hyperlipidemia     Patient Active Problem List   Diagnosis Date Noted  . Chronic systolic heart failure 28/78/6767  . Hyperlipidemia     Past Surgical History  Procedure Laterality Date  . Cardiac catheterization    . Coronary artery bypass graft    . Thumb fusion      Current Outpatient Rx  Name  Route  Sig  Dispense  Refill  . aspirin EC 81 MG EC tablet   Oral   Take 1 tablet (81 mg total) by mouth daily.   30 tablet   1   . atorvastatin (LIPITOR) 40 MG tablet   Oral   Take 1 tablet (40 mg total) by mouth daily at 6 PM. Patient taking differently: Take 40 mg by mouth every evening.    30 tablet   1   . furosemide (LASIX) 40 MG tablet   Oral   Take 1 tablet (40 mg total) by mouth 2 (two) times daily.   30 tablet   1   . isosorbide mononitrate (IMDUR) 30 MG 24 hr tablet   Oral   Take 1 tablet (30 mg total) by mouth daily.   30 tablet   1   . lisinopril  (PRINIVIL,ZESTRIL) 40 MG tablet   Oral   Take 40 mg by mouth daily.         . metoprolol tartrate (LOPRESSOR) 25 MG tablet   Oral   Take 25 mg by mouth 2 (two) times daily.         . nitroGLYCERIN (NITROSTAT) 0.4 MG SL tablet   Sublingual   Place 1 tablet (0.4 mg total) under the tongue every 5 (five) minutes as needed. For chest pain. May take up to 3 doses.   20 tablet   5   . omeprazole (PRILOSEC) 20 MG capsule   Oral   Take 20 mg by mouth daily.         . Potassium Chloride ER 20 MEQ TBCR   Oral   Take 20 mEq by mouth daily.   30 tablet   1     While taking the lasix   . spironolactone (ALDACTONE) 25 MG tablet   Oral   Take 1 tablet (25 mg total) by mouth daily.   30 tablet   1     Allergies Review of patient's allergies indicates no known allergies.  Family History  Problem Relation Age of Onset  . CAD Father   . CAD  Sister     Social History History  Substance Use Topics  . Smoking status: Former Smoker    Types: Cigarettes    Quit date: 01/20/1995  . Smokeless tobacco: Not on file  . Alcohol Use: No    Review of Systems Constitutional: No fever/chills Eyes: No visual changes. ENT: No sore throat. Cardiovascular: Denies chest pain. Respiratory: Denies shortness of breath. Gastrointestinal: Complains of mild suprapubic pain but none at this time. Denies any dysuria  No nausea, no vomiting.  No diarrhea.  No constipation. Genitourinary: Negative for dysuria. Musculoskeletal: Negative for back pain. Skin: Negative for rash. Neurological: Right-sided weakness and numbness to the right side of the face.   10-point ROS otherwise negative.  ____________________________________________   PHYSICAL EXAM:  VITAL SIGNS: ED Triage Vitals  Enc Vitals Group     BP 02/11/15 0950 139/92 mmHg     Pulse Rate 02/11/15 0950 62     Resp 02/11/15 0950 22     Temp 02/11/15 0950 97.7 F (36.5 C)     Temp Source 02/11/15 0950 Oral     SpO2 02/11/15  0950 97 %     Weight 02/11/15 0939 202 lb (91.627 kg)     Height 02/11/15 0939 5\' 9"  (1.753 m)     Head Cir --      Peak Flow --      Pain Score --      Pain Loc --      Pain Edu? --      Excl. in [place]? --     Constitutional: Alert and oriented. Well appearing and in no acute distress. Eyes: Conjunctivae are normal. PERRL. EOMI. Head: Atraumatic. Nose: No congestion/rhinnorhea. Mouth/Throat: Mucous membranes are moist.  Oropharynx non-erythematous. Neck: No stridor.   Cardiovascular: Normal rate, regular rhythm. Grossly normal heart sounds.  Good peripheral circulation. Respiratory: Normal respiratory effort.  No retractions. Lungs CTAB. Gastrointestinal: Soft and nontender. No distention. No abdominal bruits. No CVA tenderness. Musculoskeletal: No lower extremity tenderness nor edema.  No joint effusions. Neurologic:  Slurred speech. Right upper lower extremities with 4 out of 5 strength. Mild right-sided facial droop. Skin:  Skin is warm, dry and intact. No rash noted. Psychiatric: Mood and affect are normal.  NIH Stroke Scale   Time: 1020 Person Administering Scale: Doran Stabler  Administer stroke scale items in the order listed. Record performance in each category after each subscale exam. Do not go back and change scores. Follow directions provided for each exam technique. Scores should reflect what the patient does, not what the clinician thinks the patient can do. The clinician should record answers while administering the exam and work quickly. Except where indicated, the patient should not be coached (i.e., repeated requests to patient to make a special effort).   1a  Level of consciousness: 0=alert; keenly responsive  1b. LOC questions:  0=Performs both tasks correctly  1c. LOC commands: 0=Performs both tasks correctly  2.  Best Gaze: 0=normal  3.  Visual: 0=No visual loss  4. Facial Palsy: 2=Partial paralysis (total or near total paralysis of the lower face)   5a.  Motor left arm: 0=No drift, limb holds 90 (or 45) degrees for full 10 seconds  5b.  Motor right arm: 1=Drift, limb holds 90 (or 45) degrees but drifts down before full 10 seconds: does not hit bed  6a. motor left leg: 0=No drift, limb holds 90 (or 45) degrees for full 10 seconds  6b  Motor right leg:  2=Some effort against gravity, limb cannot get to or maintain (if cured) 90 (or 45) degrees, drifts down to bed, but has some effort against gravity  7. Limb Ataxia: 1=Present in one limb  8.  Sensory: 0=Normal; no sensory loss  9. Best Language:  0  10. Dysarthria: 1  11. Extinction and Inattention: 0=No abnormality  12. Distal motor function: 0=Normal   Total:   7   ____________________________________________   LABS (all labs ordered are listed, but only abnormal results are displayed)  Labs Reviewed  CBC - Abnormal; Notable for the following:    RDW 14.7 (*)    Platelets 125 (*)    All other components within normal limits  COMPREHENSIVE METABOLIC PANEL  URINALYSIS COMPLETEWITH MICROSCOPIC ([facility] ONLY)  CBG MONITORING, ED   ____________________________________________  EKG  ED ECG REPORT I, Doran Stabler, the attending physician, personally viewed and interpreted this ECG.   Date: 02/11/2015  EKG Time: 943  Rate: 66  Rhythm: normal sinus rhythm  Axis: Normal axis  Intervals:right bundle branch block  ST&T Change: T wave inversions in II, III, and F aVF. No ST elevations or depressions.  ____________________________________________  RADIOLOGY  CT of the brain with low density in the left pons suspect recent infarct. ____________________________________________   PROCEDURES  Patient with NIH stroke scale of 6. Patient out of window for TPA.  ____________________________________________   INITIAL IMPRESSION / ASSESSMENT AND PLAN / ED COURSE  Pertinent labs & imaging results that were available during my care of the patient were reviewed by me and  considered in my medical decision making (see chart for details).  ----------------------------------------- 12:07 PM on 02/11/2015 -----------------------------------------  Patient be admitted for likely acute infarct. Out of time window for TPA. Given aspirin. Explained CT findings with patient and daughter. Signed out to Dr. Tressia Miners. ____________________________________________   FINAL CLINICAL IMPRESSION(S) / ED DIAGNOSES  Acute CVA. Initial visit.    Orbie Pyo, MD 02/11/15 [phone]

## 2015-02-12 ENCOUNTER — Encounter: Payer: Self-pay | Admitting: Radiology

## 2015-02-12 ENCOUNTER — Inpatient Hospital Stay: Payer: Medicare Other

## 2015-02-12 DIAGNOSIS — G8190 Hemiplegia, unspecified affecting unspecified side: Secondary | ICD-10-CM

## 2015-02-12 LAB — LIPID PANEL
CHOL/HDL RATIO: 4.5 ratio
Cholesterol: 139 mg/dL (ref 0–200)
HDL: 31 mg/dL — AB (ref >40)
LDL Cholesterol: 90 mg/dL (ref 0–99)
Triglycerides: 90 mg/dL (ref <150)
VLDL: 18 mg/dL (ref 0–40)

## 2015-02-12 LAB — CBC
HCT: 41.7 % (ref 40.0–52.0)
HEMOGLOBIN: 14 g/dL (ref 13.0–18.0)
MCH: 29.1 pg (ref 26.0–34.0)
MCHC: 33.7 g/dL (ref 32.0–36.0)
MCV: 86.4 fL (ref 80.0–100.0)
Platelets: 124 10*3/uL — ABNORMAL LOW (ref 150–440)
RBC: 4.82 MIL/uL (ref 4.40–5.90)
RDW: 14.2 % (ref 11.5–14.5)
WBC: 6.9 10*3/uL (ref 3.8–10.6)

## 2015-02-12 LAB — BASIC METABOLIC PANEL
ANION GAP: 8 (ref 5–15)
BUN: 19 mg/dL (ref 6–20)
CALCIUM: 9.2 mg/dL (ref 8.9–10.3)
CO2: 27 mmol/L (ref 22–32)
Chloride: 105 mmol/L (ref 101–111)
Creatinine, Ser: 1.09 mg/dL (ref 0.61–1.24)
GFR calc Af Amer: >60 mL/min (ref >60)
GFR calc non Af Amer: >60 mL/min (ref >60)
GLUCOSE: 110 mg/dL — AB (ref 65–99)
Potassium: 4.2 mmol/L (ref 3.5–5.1)
SODIUM: 140 mmol/L (ref 135–145)

## 2015-02-12 LAB — HEMOGLOBIN A1C: Hgb A1c MFr Bld: 6.6 % — ABNORMAL HIGH (ref 4.0–6.0)

## 2015-02-12 MED ORDER — ASPIRIN 325 MG PO TABS
325.0000 mg | ORAL_TABLET | Freq: Every day | ORAL | Status: DC
  Administered 2015-02-12 – 2015-02-13 (×2): 325 mg via ORAL
  Filled 2015-02-12 (×2): qty 1

## 2015-02-12 MED ORDER — IOHEXOL 350 MG/ML SOLN
100.0000 mL | Freq: Once | INTRAVENOUS | Status: AC | PRN
  Administered 2015-02-12: 80 mL via INTRAVENOUS

## 2015-02-12 MED ORDER — CLOPIDOGREL BISULFATE 75 MG PO TABS
75.0000 mg | ORAL_TABLET | Freq: Every day | ORAL | Status: DC
  Administered 2015-02-12 – 2015-02-13 (×2): 75 mg via ORAL
  Filled 2015-02-12 (×2): qty 1

## 2015-02-12 NOTE — Consult Note (Signed)
CC: R sided weakness   HPI: Dillon [name] is an 69 y.o. male with a known history of chronic systolic CHF coronary artery disease status post MI and status post coronary bypass grafting history of chronic dyspnea, hypertension and hyperlipidemia who presents to the hospital with complaints of 4-5 day history of right-sided weakness and dysarthria. At baseline pt states he was on ASA 81. Pt has L pontine infarct. On imaging pt is R vertebral dominant and there is a clot that extents to his basilar which is likely the cause of the pontine stroke.       Past Medical History  Diagnosis Date  . Acute MI   . Coronary artery disease   . CHF (congestive heart failure)   . Hypertension   . Hyperlipidemia     Past Surgical History  Procedure Laterality Date  . Cardiac catheterization    . Coronary artery bypass graft    . Thumb fusion      Family History  Problem Relation Age of Onset  . CAD Father   . CAD Sister     Social History:  reports that he quit smoking about 20 years ago. His smoking use included Cigarettes. He does not have any smokeless tobacco history on file. He reports that he does not drink alcohol. His drug history is not on file.  No Known Allergies  Medications: I have reviewed the patient's current medications.  ROS: History obtained from the patient  General ROS: negative for - chills, fatigue, fever, night sweats, weight gain or weight loss Psychological ROS: negative for - behavioral disorder, hallucinations, memory difficulties, mood swings or suicidal ideation Ophthalmic ROS: negative for - blurry vision, double vision, eye pain or loss of vision ENT ROS: negative for - epistaxis, nasal discharge, oral lesions, sore throat, tinnitus or vertigo Allergy and Immunology ROS: negative for - hives or itchy/watery eyes Hematological and Lymphatic ROS: negative for - bleeding problems, bruising or swollen lymph nodes Endocrine ROS: negative for -  galactorrhea, hair pattern changes, polydipsia/polyuria or temperature intolerance Respiratory ROS: negative for - cough, hemoptysis, shortness of breath or wheezing Cardiovascular ROS: negative for - chest pain, dyspnea on exertion, edema or irregular heartbeat Gastrointestinal ROS: negative for - abdominal pain, diarrhea, hematemesis, nausea/vomiting or stool incontinence Genito-Urinary ROS: negative for - dysuria, hematuria, incontinence or urinary frequency/urgency Musculoskeletal ROS: negative for - joint swelling or muscular weakness Neurological ROS: as noted in HPI Dermatological ROS: negative for rash and skin lesion changes  Physical Examination: Blood pressure 140/75, pulse 61, temperature 97.3 F (36.3 C), temperature source Oral, resp. rate 18, height 5\' 9"  (1.753 m), weight 93.486 kg (206 lb 1.6 oz), SpO2 96 %.    Neurological Examination Mental Status: Alert, oriented, thought content appropriate.  Dysarthric speech.  Able to follow 3 step commands without difficulty. Cranial Nerves: II: Discs flat bilaterally; Visual fields grossly normal, pupils equal, round, reactive to light and accommodation III,IV, VI: ptosis not present, extra-ocular motions intact bilaterally V,VII: R facial droop  VIII: hearing normal bilaterally IX,X: gag reflex present XI: bilateral shoulder shrug XII: midline tongue extension Motor: Right : Upper extremity   4/5    Left:     Upper extremity   5/5  Lower extremity   4/5     Lower extremity   5/5 Tone and bulk:normal tone throughout; no atrophy noted Sensory: Pinprick and light touch intact throughout, bilaterally Deep Tendon Reflexes: 1+ and symmetric throughout Plantars: Right: downgoing   Left: downgoing  Cerebellar: normal finger-to-nose, normal rapid alternating movements and normal heel-to-shin test Gait: not examined.       Laboratory Studies:   Basic Metabolic Panel:  Recent Labs Lab 02/11/15 1014 02/12/15 0601  NA 141  140  K 4.2 4.2  CL 108 105  CO2 26 27  GLUCOSE 121* 110*  BUN 16 19  CREATININE 0.96 1.09  CALCIUM 9.5 9.2    Liver Function Tests:  Recent Labs Lab 02/11/15 1014  AST 27  ALT 16*  ALKPHOS 131*  BILITOT 1.1  PROT 7.3  ALBUMIN 3.8   No results for input(s): LIPASE, AMYLASE in the last 168 hours. No results for input(s): AMMONIA in the last 168 hours.  CBC:  Recent Labs Lab 02/11/15 1014 02/12/15 0601  WBC 6.3 6.9  HGB 14.1 14.0  HCT 42.6 41.7  MCV 87.3 86.4  PLT 125* 124*    Cardiac Enzymes: No results for input(s): CKTOTAL, CKMB, CKMBINDEX, TROPONINI in the last 168 hours.  BNP: Invalid input(s): POCBNP  CBG: No results for input(s): GLUCAP in the last 168 hours.  Microbiology: Results for orders placed or performed in visit on 01/23/14  Culture, blood (single)     Status: None   Collection Time: 01/23/14 11:54 PM  Result Value Ref Range Status   Micro Text Report   Final       COMMENT                   NO GROWTH AEROBICALLY/ANAEROBICALLY IN 5 DAYS   ANTIBIOTIC                                                      Culture, blood (single)     Status: None   Collection Time: 01/23/19 11:49 PM  Result Value Ref Range Status   Micro Text Report   Final       COMMENT                   NO GROWTH AEROBICALLY/ANAEROBICALLY IN 5 DAYS   ANTIBIOTIC                                                        Coagulation Studies: No results for input(s): LABPROT, INR in the last 72 hours.  Urinalysis:  Recent Labs Lab 02/11/15 1223  COLORURINE YELLOW*  LABSPEC 1.008  PHURINE 5.0  GLUCOSEU NEGATIVE  HGBUR NEGATIVE  BILIRUBINUR NEGATIVE  KETONESUR NEGATIVE  PROTEINUR NEGATIVE  NITRITE NEGATIVE  LEUKOCYTESUR NEGATIVE    Lipid Panel:     Component Value Date/Time   CHOL 139 02/12/2015 0601   TRIG 90 02/12/2015 0601   HDL 31* 02/12/2015 0601   CHOLHDL 4.5 02/12/2015 0601   VLDL 18 02/12/2015 0601   LDLCALC 90 02/12/2015 0601    HgbA1C:   Lab Results  Component Value Date   HGBA1C 6.6* 02/11/2015    Urine Drug Screen:  No results found for: LABOPIA, COCAINSCRNUR, LABBENZ, AMPHETMU, THCU, LABBARB  Alcohol Level: No results for input(s): ETH in the last 168 hours.    Imaging: Dg Chest 1 View  02/11/2015   CLINICAL DATA:  Weakness, altered mental status  EXAM: CHEST  1 VIEW  COMPARISON:  01/20/2015  FINDINGS: Cardiomegaly is noted with slight progression from prior exam. Status post CABG. No acute infiltrate or pleural effusion. No pulmonary edema.  IMPRESSION: No active disease. Status post CABG. Cardiomegaly with progression from prior exam.   Electronically Signed   By: Lahoma Crocker M.D.   On: 02/11/2015 11:20   Ct Head Wo Contrast  02/11/2015   CLINICAL DATA:  Weakness, right-sided, with slurred speech since Friday. Altered mental status.  EXAM: CT HEAD WITHOUT CONTRAST  TECHNIQUE: Contiguous axial images were obtained from the base of the skull through the vertex without intravenous contrast.  COMPARISON:  None.  FINDINGS: Skull and Sinuses:Negative for fracture or destructive process. The mastoids, middle ears, and imaged paranasal sinuses are clear.  Orbits: No acute abnormality.  Brain: Approximately 1 cm low-density in the left posterior pons. Given provided history this may reflect an acute to subacute infarct with corticospinal and bulbar deficits. There is also a remote appearing lacunar infarct in the right caudate head. Cerebral volume is within normal limits for age. No evidence of cortical infarct. No hemorrhage, hydrocephalus, or mass lesion.  These results were called by telephone at the time of interpretation on 02/11/2015 at 10:49 am to Dr. Larae Grooms , who verbally acknowledged these results.  IMPRESSION: 1. Low-density in the left pons, suspect recent infarct given the history. MRI could confirm. 2. 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   Electronically Signed   By: Monte Fantasia M.D.   On:  02/11/2015 10:52   Ct Angio Neck W/cm &/or Wo/cm  02/12/2015   CLINICAL DATA:  Right-sided weakness, slurred speech, and dysarthria beginning 1 day ago. Stroke. Left carotid stenosis on ultrasound.  EXAM: CT ANGIOGRAPHY NECK  TECHNIQUE: Multidetector CT imaging of the neck was performed using the standard protocol during bolus administration of intravenous contrast. Multiplanar CT image reconstructions and MIPs were obtained to evaluate the vascular anatomy. Carotid stenosis measurements (when applicable) are obtained utilizing NASCET criteria, using the distal internal carotid diameter as the denominator.  CONTRAST:  27mL OMNIPAQUE IOHEXOL 350 MG/ML SOLN  COMPARISON:  Carotid Doppler ultrasound 02/11/2015  FINDINGS: Aortic arch: 3 vessel aortic arch. Mild, predominantly noncalcified plaque in the proximal brachiocephalic artery without stenosis. Mild narrowing of the right subclavian artery origin by eccentric plaque. No left subclavian artery stenosis.  Right carotid system: Common carotid artery is patent with focal, eccentric, predominantly noncalcified plaque just proximal to the bifurcation which results in less than 50% stenosis. Moderate calcified plaque at the carotid bifurcation does not result in significant stenosis. Mid cervical ICA is tortuous.  Left carotid system: Common carotid artery is patent without proximal stenosis. There is rather extensive, predominantly noncalcified plaque in the distal common carotid artery extending into the proximal ICA. This results in approximately 65% stenosis of the distal common carotid artery and approximately 50% stenosis of the ICA origin. Remainder of the cervical ICA is unremarkable.  Vertebral arteries: Vertebral arteries are patent with the right being mildly dominant. Focal plaque at the right vertebral artery origin does not result in significant stenosis. There is less than 50% narrowing of the distal right V3 segment. There is mild stenosis of the  proximal right V4 segment due to prominent calcified plaque. There is a segmental occlusion or possibly extremely high-grade stenosis involving the distal right V4 segment/vertebrobasilar junction spanning 5 mm (a patent lumen is not clearly identified). The left vertebral artery ends in PICA. Portions  of the left V1 and proximal V2 segments are not well evaluated due to adjacent dense venous contrast. There is likely at least a moderate stenosis of the left vertebral artery at its origin. The visualized portion of the basilar artery appears diffusely irregular with moderate stenosis in its midportion.  Skeleton: Moderate multilevel cervical disc degeneration, greatest from C4-5 to C6-7 with suspected moderate spinal stenosis at these levels. Asymmetric, severe left-sided facet arthrosis is noted at C2-3 and C3-4 with severe left neural foraminal stenosis at C3-4.  Other neck: Left pleural effusion is partially visualized, likely small to moderate in size. Sequelae of prior CABG are partially visualized.  IMPRESSION: 1. Prominent plaque in the left carotid bulb with 65% stenosis of the distal common carotid artery and 50% stenosis of the ICA origin. 2. Moderate plaque at the right carotid bifurcation without significant stenosis. 3. Apparent 5 mm long occlusion of the distal most aspect of the dominant right vertebral artery/vertebrobasilar junction. Left vertebral artery ends in PICA with a likely moderate stenosis of its origin. 4. Left pleural effusion.   Electronically Signed   By: Logan Bores   On: 02/12/2015 10:44   Mr Brain Wo Contrast  02/12/2015   CLINICAL DATA:  69 year old hypertensive male with history of hyperlipidemia and acute infarct presenting with right-sided weakness, speech issues and weakness in the legs with some falls over the past 5 days. Subsequent encounter.  EXAM: MRI HEAD WITHOUT CONTRAST  TECHNIQUE: Multiplanar, multiecho pulse sequences of the brain and surrounding structures were  obtained without intravenous contrast.  COMPARISON:  02/12/2015 CT angiogram head and neck. 02/11/2015 head CT. No comparison brain MR.  FINDINGS: Acute nonhemorrhagic left paracentral pontine infarct.  Acute small infarct at the junction of the inferior aspect of the left temporal-occipital lobe.  Mild small vessel disease type changes.  No intracranial hemorrhage.  Global atrophy without hydrocephalus.  No intracranial mass lesion noted on this unenhanced exam.  Left vertebral artery is small appearing to end in a posterior inferior cerebellar artery distribution with superimposed atherosclerotic type changes. Narrowing of the distal right vertebral artery and basilar artery. Internal carotid arteries are patent.  Mild spinal stenosis C3-4. Cervical medullary junction, pituitary region, pineal region and orbital structures unremarkable.  IMPRESSION: Acute nonhemorrhagic left paracentral pontine infarct.  Acute small infarct at the junction of the inferior aspect of the left temporal-occipital lobe.  Mild small vessel disease type changes.  No intracranial hemorrhage.  Global atrophy without hydrocephalus.  No intracranial mass lesion noted on this unenhanced exam.  Left vertebral artery is small appearing to end in a posterior inferior cerebellar artery distribution with superimposed atherosclerotic type changes. Narrowing of the distal right vertebral artery and basilar artery.   Electronically Signed   By: Genia Del M.D.   On: 02/12/2015 11:04   US Carotid Bilateral  02/11/2015   CLINICAL DATA:  Cerebrovascular accident.  EXAM: BILATERAL CAROTID DUPLEX ULTRASOUND  TECHNIQUE: Pearline Cables scale imaging, color Doppler and duplex ultrasound were performed of bilateral carotid and vertebral arteries in the neck.  COMPARISON:  None.  FINDINGS: Criteria: Quantification of carotid stenosis is based on velocity parameters that correlate the residual internal carotid diameter with NASCET-based stenosis levels, using the  diameter of the distal internal carotid lumen as the denominator for stenosis measurement.  The following velocity measurements were obtained:  RIGHT  ICA:  96/19 cm/sec  CCA:  40/98 cm/sec  SYSTOLIC ICA/CCA RATIO:  0.9  DIASTOLIC ICA/CCA RATIO:  2.7  ECA:  127 cm/sec  LEFT  ICA:  175/36 cm/sec  CCA:  10/62 cm/sec  SYSTOLIC ICA/CCA RATIO:  2.4  DIASTOLIC ICA/CCA RATIO:  2.9  ECA:  166 cm/sec  RIGHT CAROTID ARTERY: Mild calcified plaque is noted in distal right common carotid artery and bulb which extends into the proximal right internal carotid artery consistent with less than 50% diameter stenosis based on ultrasound and Doppler criteria.  RIGHT VERTEBRAL ARTERY:  Antegrade flow is noted.  LEFT CAROTID ARTERY: Large amount of eccentric plaque formation is noted in the left carotid bulb which appears to be partially calcified and slightly irregular which extends into proximal left internal carotid artery. It demonstrates peak systolic velocity of 694 centimeters/second within the bulb which is concerning for greater than 70% diameter stenosis based on ultrasound and Doppler criteria.  LEFT VERTEBRAL ARTERY:  Antegrade flow is noted.  IMPRESSION: Mild calcified plaque is noted in the proximal right internal carotid artery which does not appear to be hemodynamically significant.  Large amount of eccentric, partially calcified and slightly irregular plaque is noted in the left carotid bulb which extends into the proximal left internal carotid artery. Based on peak systolic velocity measured within the bulb, this is greater than 70% diameter stenosis based on ultrasound and Doppler criteria. CT angiography may be performed for further evaluation.   Electronically Signed   By: Marijo Conception, M.D.   On: 02/11/2015 18:15     Assessment/Plan:  69 y.o. Male with a known history of chronic systolic CHF coronary artery disease status post MI and status post coronary bypass grafting history of chronic dyspnea,  hypertension and hyperlipidemia who presents to the hospital with complaints of 4-5 day history of right-sided weakness and dysarthria. At baseline pt states he was on ASA 81. Pt has L pontine infarct. On imaging pt is R vertebral dominant and there is a clot that extents to his basilar which is likely the cause of the pontine stroke.  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   Pt is not on dual anti platelet therapy which has to be continued for at least 90 days after which he can be switched to monotherapy.  - PT/OT - R internal carotid stenosis that should be followed up as out pt in 3-6 months.   - will likely need rehab.     02/12/2015, 12:22 PM

## 2015-02-12 NOTE — Progress Notes (Signed)
Patient ID: Dillon [name], male   DOB: Jul 28, 1946, 69 y.o.   MRN: [id number] [facility] PROGRESS NOTE  HPI/Subjective: Patient with difficulty getting his words out as he would like. He knows what he wants to say just can't say it as well as he would like. Some slurred speech. Weakness on the right side. Some cough. No difficulty swallowing. Patient had a headache on Saturday and normally doesn't have a headache. He states that his medications were changed recently and he hasn't had problems since then.  Objective: Filed Vitals:   02/12/15 0505  BP: 126/76  Pulse: 54  Temp: 97.8 F (36.6 C)  Resp: 18    Intake/Output Summary (Last 24 hours) at 02/12/15 0809 Last data filed at 02/12/15 0311  Gross per 24 hour  Intake    120 ml  Output    250 ml  Net   -130 ml   Filed Weights   02/11/15 0939 02/12/15 0505  Weight: 91.627 kg (202 lb) 93.486 kg (206 lb 1.6 oz)    ROS: Review of Systems  Constitutional: Negative for fever and chills.  Eyes: Negative for blurred vision.  Respiratory: Positive for cough. Negative for sputum production and shortness of breath.   Cardiovascular: Negative for chest pain.  Gastrointestinal: Negative for nausea, vomiting, abdominal pain, diarrhea and constipation.  Genitourinary: Negative for dysuria.  Musculoskeletal: Negative for joint pain.  Neurological: Positive for focal weakness and weakness. Negative for dizziness and headaches.   Exam: Physical Exam  Constitutional: He is oriented to person, place, and time.  HENT:  Nose: No mucosal edema.  Mouth/Throat: No oropharyngeal exudate or posterior oropharyngeal edema.  Eyes: Conjunctivae, EOM and lids are normal. Pupils are equal, round, and reactive to light.  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  Neck: No JVD present. Carotid bruit is not present. No edema present. No thyroid mass and no thyromegaly present.  Cardiovascular: S1 normal and S2 normal.  Exam reveals  no gallop.   No murmur heard. Pulses:      Dorsalis pedis pulses are 2+ on the right side, and 2+ on the left side.  Respiratory: No respiratory distress. He has no wheezes. He has no rhonchi. He has no rales.  GI: Soft. Bowel sounds are normal. There is no tenderness.  Musculoskeletal:       Right ankle: He exhibits swelling.       Left ankle: He exhibits swelling.  Lymphadenopathy:    He has no cervical adenopathy.  Neurological: He is alert and oriented to person, place, and time.  Right-sided weakness 4 out of 5 upper and lower extremity. Positive for slurred speech and right facial droop.  Skin: Skin is warm. No rash noted. Nails show no clubbing.  Psychiatric: He has a normal mood and affect.    Data Reviewed: Basic Metabolic Panel:  Recent Labs Lab 02/11/15 1014 02/12/15 0601  NA 141 140  K 4.2 4.2  CL 108 105  CO2 26 27  GLUCOSE 121* 110*  BUN 16 19  CREATININE 0.96 1.09  CALCIUM 9.5 9.2   Liver Function Tests:  Recent Labs Lab 02/11/15 1014  AST 27  ALT 16*  ALKPHOS 131*  BILITOT 1.1  PROT 7.3  ALBUMIN 3.8   CBC:  Recent Labs Lab 02/11/15 1014 02/12/15 0601  WBC 6.3 6.9  HGB 14.1 14.0  HCT 42.6 41.7  MCV 87.3 86.4  PLT 125* 124*    Studies: Dg Chest 1 View  02/11/2015   CLINICAL DATA:  Weakness, altered mental status  EXAM: CHEST  1 VIEW  COMPARISON:  01/20/2015  FINDINGS: Cardiomegaly is noted with slight progression from prior exam. Status post CABG. No acute infiltrate or pleural effusion. No pulmonary edema.  IMPRESSION: No active disease. Status post CABG. Cardiomegaly with progression from prior exam.   Electronically Signed   By: Lahoma Crocker M.D.   On: 02/11/2015 11:20   Ct Head Wo Contrast  02/11/2015   CLINICAL DATA:  Weakness, right-sided, with slurred speech since Friday. Altered mental status.  EXAM: CT HEAD WITHOUT CONTRAST  TECHNIQUE: Contiguous axial images were obtained from the base of the skull through the vertex without  intravenous contrast.  COMPARISON:  None.  FINDINGS: Skull and Sinuses:Negative for fracture or destructive process. The mastoids, middle ears, and imaged paranasal sinuses are clear.  Orbits: No acute abnormality.  Brain: Approximately 1 cm low-density in the left posterior pons. Given provided history this may reflect an acute to subacute infarct with corticospinal and bulbar deficits. There is also a remote appearing lacunar infarct in the right caudate head. Cerebral volume is within normal limits for age. No evidence of cortical infarct. No hemorrhage, hydrocephalus, or mass lesion.  These results were called by telephone at the time of interpretation on 02/11/2015 at 10:49 am to Dr. Larae Grooms , who verbally acknowledged these results.  IMPRESSION: 1. Low-density in the left pons, suspect recent infarct given the history. MRI could confirm. 2. 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   Electronically Signed   By: Monte Fantasia M.D.   On: 02/11/2015 10:52   US Carotid Bilateral  02/11/2015   CLINICAL DATA:  Cerebrovascular accident.  EXAM: BILATERAL CAROTID DUPLEX ULTRASOUND  TECHNIQUE: Pearline Cables scale imaging, color Doppler and duplex ultrasound were performed of bilateral carotid and vertebral arteries in the neck.  COMPARISON:  None.  FINDINGS: Criteria: Quantification of carotid stenosis is based on velocity parameters that correlate the residual internal carotid diameter with NASCET-based stenosis levels, using the diameter of the distal internal carotid lumen as the denominator for stenosis measurement.  The following velocity measurements were obtained:  RIGHT  ICA:  96/19 cm/sec  CCA:  66/06 cm/sec  SYSTOLIC ICA/CCA RATIO:  0.9  DIASTOLIC ICA/CCA RATIO:  2.7  ECA:  127 cm/sec  LEFT  ICA:  175/36 cm/sec  CCA:  30/16 cm/sec  SYSTOLIC ICA/CCA RATIO:  2.4  DIASTOLIC ICA/CCA RATIO:  2.9  ECA:  166 cm/sec  RIGHT CAROTID ARTERY: Mild calcified plaque is noted in distal right common carotid  artery and bulb which extends into the proximal right internal carotid artery consistent with less than 50% diameter stenosis based on ultrasound and Doppler criteria.  RIGHT VERTEBRAL ARTERY:  Antegrade flow is noted.  LEFT CAROTID ARTERY: Large amount of eccentric plaque formation is noted in the left carotid bulb which appears to be partially calcified and slightly irregular which extends into proximal left internal carotid artery. It demonstrates peak systolic velocity of 010 centimeters/second within the bulb which is concerning for greater than 70% diameter stenosis based on ultrasound and Doppler criteria.  LEFT VERTEBRAL ARTERY:  Antegrade flow is noted.  IMPRESSION: Mild calcified plaque is noted in the proximal right internal carotid artery which does not appear to be hemodynamically significant.  Large amount of eccentric, partially calcified and slightly irregular plaque is noted in the left carotid bulb which extends into the proximal left internal carotid artery. Based on peak systolic velocity  measured within the bulb, this is greater than 70% diameter stenosis based on ultrasound and Doppler criteria. CT angiography may be performed for further evaluation.   Electronically Signed   By: Marijo Conception, M.D.   On: 02/11/2015 18:15    Scheduled Meds: . atorvastatin  40 mg Oral q1800  . clopidogrel  75 mg Oral Daily  . docusate sodium  100 mg Oral BID  . furosemide  40 mg Oral BID  . heparin  5,000 Units Subcutaneous 3 times per day  . lisinopril  20 mg Oral Daily  . metoprolol tartrate  25 mg Oral BID  . pantoprazole  40 mg Oral Daily  . potassium chloride SA  20 mEq Oral Daily  . sodium chloride  3 mL Intravenous Q12H  . spironolactone  25 mg Oral Daily   Continuous Infusions:   Assessment/Plan:  1. 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 Found to have carotid stenosis on ultrasound of the carotids. Continue telemetry monitoring to rule out arrhythmia. MRI of the brain,  echocardiogram, CT angiogram of the carotids will be done. We'll get physical therapy and occupational therapy and speech therapy consultations. Will take a step up in treatment with Plavix since the patient states that he takes aspirin at home. Check a lipid profile and continue atorvastatin. Neuro consultation ordered by admitting physician. May end up needing vascular surgery consultation. 2. Essential hypertension- blood pressure currently stable on current medications. Need to avoid blood pressure getting too low. 3. Hyperlipidemia unspecified continue atorvastatin and check a lipid profile. 4. History of congestive heart failure- patient is not in congestive heart failure at this time. 5. Gastroesophageal reflux disease without esophagitis- on omeprazole at home, Protonix while here.  Code Status:     Code Status Orders        Start     Ordered   02/11/15 1358  Full code   Continuous     02/11/15 1357     Disposition Plan: To be determined depending on physical therapy evaluation.  Time spent: 35 minutes.  Loletha Grayer  [facility]

## 2015-02-12 NOTE — Evaluation (Addendum)
Occupational Therapy Evaluation Patient Details Name: Dillon [name] MRN: [id number] DOB: 1945/12/25 Today's Date: 02/12/2015    History of Present Illness 68 yo male with onset of B LE edema, CHF and chronic elevation of troponin, currently trending down from admit.  PMHx:  HLD, CAD, MI, CABG, EF 25%.   Clinical Impression   This 69 year old male came to [facility] with the above symptoms and has been diagnosed now with a CVA. He lives with his son and daughter in law and had been independent with BADL. He has strength and sensory deficits in his R UE. He has mild diminished sensation in right hand including light touch, stereognosis, sharp, and temperature. 9 hole peg test completed in 41 seconds on the left and 55 seconds on the right. He would benefit from Occupational Therapy for ADL/functioal mobility training.    Follow Up Recommendations  Outpatient OT    Equipment Recommendations    continue to evaluate    Recommendations for Other Services       Precautions / Restrictions Precautions Precautions: Fall Restrictions Weight Bearing Restrictions: No      Mobility Bed Mobility               General bed mobility comments:  Transfers Overall transfer level: Modified independent Equipment used: Straight cane             General transfer comment: Pt able to perform sit -> stand with min assist for support with single point cane. Pt is able to perform stand -> sit with CGA for safety     Balance                                            ADL                                         General ADL Comments: Previously independent using cane with BADL. Now needs assist with fasteners otherwise minimal assist for lower body dressing and setup for upper body dressing.     Vision     Perception     Praxis      Pertinent Vitals/Pain Pain Assessment: No/denies pain     Hand Dominance Right    Extremity/Trunk Assessment Upper Extremity Assessment Upper Extremity Assessment: RUE deficits/detail RUE Deficits / Details: L UE range WNL R UE range WFL. strength L shoulder 4+/5 distal motions are 5/5 - R UE shoudler 3+/5, elbow 4-/5, forearm and wrist 5/5 - Grip L 65 lbs, R 35 lbs.    Lower Extremity Assessment Lower Extremity Assessment:  RLE Deficits / Details:        Communication Communication Communication:  (Slight slurr but understadable)   Cognition Arousal/Alertness: Awake/alert Behavior During Therapy: WFL for tasks assessed/performed Overall Cognitive Status: Within Functional Limits for tasks assessed                     General Comments       Exercises Exercises: Other exercises Other Exercises Other Exercises:    Shoulder Instructions      Home Living Family/patient expects to be discharged to:: Private residence Living Arrangements: Children (Lives with daughter and son in [place]) Available Help at Discharge: Family Type of Home: House Home Access:  Stairs to enter [employer] of Steps: 5 Entrance Stairs-Rails: Left Home Layout: One level               Home Equipment: Cane - quad;Cane - single point;Shower seat   Additional Comments: Family members are able to drive patient to therapy       Prior Functioning/Environment Level of Independence: Independent with assistive device(s)             OT Diagnosis: Hemiplegia dominant side   OT Problem List: Decreased strength;Impaired balance (sitting and/or standing);Decreased coordination;Impaired sensation   OT Treatment/Interventions: Self-care/ADL training;Therapeutic exercise;Neuromuscular education    OT Goals(Current goals can be found in the care plan section) Acute Rehab OT Goals Patient Stated Goal: "to get discharged" OT Goal Formulation: With patient Time For Goal Achievement: 02/26/15 Potential to Achieve Goals: Good  OT Frequency: Min 1X/week   Barriers to  D/C:            Co-evaluation              End of Session Equipment Utilized During Treatment:  (Stroke test kit)  Activity Tolerance: Patient tolerated treatment well Patient left: in chair;with call bell/phone within reach;with chair alarm set   Time: 2787-1836 OT Time Calculation (min): 23 min Charges:  OT Evaluation $Initial OT Evaluation Tier I: 1 Procedure G-Codes:    Myrene Galas, MS/OTR/L  02/12/2015, 4:51 PM

## 2015-02-12 NOTE — Care Management (Signed)
Spoke with patient and his daughter about current discharge options and that at present diagnostic testing and initial rehab evals are still in progress.  Patient did move in with his daughter and is no longer in the boarding house.  Updated [facility]

## 2015-02-12 NOTE — Evaluation (Signed)
Clinical/Bedside Swallow Evaluation Patient Details  Name: Dillon [name] MRN: [id number] Date of Birth: 1946-04-08  Today's Date: 02/12/2015 Time: SLP Start Time (ACUTE ONLY): 0856 SLP Stop Time (ACUTE ONLY): 0950 SLP Time Calculation (min) (ACUTE ONLY): 54 min  Past Medical History:  Past Medical History  Diagnosis Date  . Acute MI   . Coronary artery disease   . CHF (congestive heart failure)   . Hypertension   . Hyperlipidemia    Past Surgical History:  Past Surgical History  Procedure Laterality Date  . Cardiac catheterization    . Coronary artery bypass graft    . Thumb fusion     HPI:  Pt denied any trouble swallowing prior to admission to the hospital. He indicated that since "this happened", he has felt some s/s of change in his talking and liquids dribbling from his mouth on the R side for ~2-3 days. He felt he knew what he wanted to say but paused sometimes to "get the words right". He indicated his speech clarity was unchanged; however, noted mild+ dysarthria and hyponasality. Pt was intelligible to follow along in conversation. Pt indicated R UE weakness in 2 fingers of his hand as well.     Assessment / Plan / Recommendation Clinical Impression  Pt appeared to safely tolerate trials of thin liquids and solids w/ no overt s/s of aspiration noted during oral intake/trials. Pt exhibited clear vocal quality b/t trials. During the oral phase, he exhibited adequate bolus control/management and oral clearing; brief mastication/mashing effort noted w/ trials of solids but pt stated this was his baseline(it appeared adequate for him). Pt fed self and appeared to monitor appropriately during self-feeding, and for oral clearing. Pt had stated when he drank from the Large jug straw, it dribbled from the R corner of his mouth. Suggested to pt to use a standard size straw and avoid the Large one as it could increase risk for aspiration sec. to the decreased bolus control. Pt appears at  reduced risk for aspiration following general aspiration precautions. Rec. meds in Puree if any trouble swallowing w/ liquids at this time. Rec. cut, moistened meats; aspiration precautions. Pt agreed.    Aspiration Risk   (reduced)    Diet Recommendation Age appropriate regular solids;Thin (meats cut and moistened d/t edentulous status)   Medication Administration: Whole meds with liquid (as tolerates) Compensations: Slow rate;Small sips/bites (chew foods well)    Other  Recommendations Recommended Consults:  (OT to assess - pt demo. deficits w/ self-feeding w/ RUE) Oral Care Recommendations: Oral care BID;Oral care before and after PO;Patient independent with oral care   Follow Up Recommendations       Frequency and Duration min 3x week  1 week   Pertinent Vitals/Pain denied    SLP Swallow Goals     Swallow Study Prior Functional Status   lived at home alone; family support    General Date of Onset: 02/11/15 Other Pertinent Information: Pt denied any trouble swallowing prior to admission to the hospital. He indicated that since "this happened", he has felt some s/s of change in his talking and liquids dribbling from his mouth on the R side for ~2-3 days. He felt he knew what he wanted to say but paused sometimes to "get the words right". He indicated his speech clarity was unchanged; however, noted mild+ dysarthria and hyponasality. Pt was intelligible to follow along in conversation. Pt indicated R UE weakness in 2 fingers of his hand as well.   Type  of Study: Bedside swallow evaluation Previous Swallow Assessment: noen indicated Diet Prior to this Study: Regular;Thin liquids Temperature Spikes Noted: No Respiratory Status: Room air History of Recent Intubation: No Behavior/Cognition: Alert;Cooperative;Pleasant mood Oral Cavity - Dentition: Edentulous (does not wear dentures) Self-Feeding Abilities: Able to feed self;Needs set up (R UE weakness) Patient Positioning: Upright  in chair/Tumbleform Baseline Vocal Quality: Normal (min. hyponasality) Volitional Cough: Strong Volitional Swallow: Able to elicit    Oral/Motor/Sensory Function Overall Oral Motor/Sensory Function: Impaired Labial ROM: Reduced right Labial Symmetry: Abnormal symmetry right (min. ) Labial Strength: Reduced Labial Sensation: Reduced Lingual ROM: Within Functional Limits Lingual Symmetry: Within Functional Limits Lingual Strength: Within Functional Limits Lingual Sensation: Within Functional Limits Facial ROM: Reduced right Facial Symmetry: Right droop Facial Strength: Reduced Facial Sensation: Reduced Mandible: Within Functional Limits   Ice Chips Ice chips: Within functional limits Presentation: Spoon;Self Fed (x3 trials)   Thin Liquid Thin Liquid: Within functional limits Presentation: Cup;Straw (~6 ozs total)    Nectar Thick Nectar Thick Liquid: Not tested   Honey Thick Honey Thick Liquid: Not tested   Puree Puree: Within functional limits Presentation: Self Fed;Spoon (4 ozs; adequate clearing)   Solid   GO    Solid: Impaired Presentation: Self Fed;Spoon (x10+ trials of french toast and eggs) Oral Phase Impairments:  (adequate) Oral Phase Functional Implications:  (adequate) Pharyngeal Phase Impairments:  (adequate) Other Comments: pt did not spend much time masticating the solid boluses; he stated he was eating as he did at baseline at home - edentulous       Watson,Katherine 02/12/2015,10:59 AM

## 2015-02-12 NOTE — Care Management (Signed)
PT is recommending patient can discharge home with home health.  This news has cheered patient.  Will anticipate return home at this point with front wheeled rolling walker- resumption of home health SN PT- add Aide, SLP and OT and SW.  Will discuss this with patient's daughter

## 2015-02-12 NOTE — Progress Notes (Deleted)
OT Cancellation Note  Patient Details Name: Dillon [name] MRN: [id number] DOB: 30-Jul-1946   Cancelled Treatment:     Attempted Occupational Therapy evaluation. Patient with Physical Therapy. Will re-try time permitting.  Sharon Mt 02/12/2015, 3:53 PM

## 2015-02-12 NOTE — Plan of Care (Signed)
Problem: Discharge/Transitional Outcomes Goal: Family/Caregiver willing and able to support plan Family/Caregiver willing and able to support plan for self-management after transition home  Outcome: Progressing Pt is alert and oriented x 4, denies pain, pt ambulated with physical therapy, continues on room air, good appetite, no problems with swallowing, has mild deficits on right side. Angiogram and head MRI performed, MD discussed results with patient and family, pt will likely d/c to rehab or to home with home health. Started on plavix and aspirin dual therapy following results from MRI.

## 2015-02-12 NOTE — Evaluation (Signed)
Physical Therapy Evaluation Patient Details Name: Dillon [name] MRN: [id number] DOB: May 02, 1946 Today's Date: 02/12/2015   History of Present Illness  69 yo male with onset of B LE edema, CHF and chronic elevation of troponin, currently trending down from admit.  PMHx:  HLD, CAD, MI, CABG, EF 25%.  Clinical Impression  Pt presents to hospital with R sided weakness and bilateral LE edema and history of CHF, HTN, CAD, MI, and CVA. Pt able to ambulate 150 ft with single point cane and RW (prefers RW) with CGA for safety. Pre-ambulation vitals - oxysat: 99, HR: 55. Post-ambulation vitals - oxysat: 95, HR: 74. Pt able to perform sit-to-stand with with min assist for physical support, and stand-to-sit with supervision for safety. Pt's primary deficits include, R side hemiplegia and decreased endurance secondary to comorbidities. Pt will benefit from skilled physical therapy to address the above deficits. PT recommendation is for outpatient therapy once he is no longer receiving home health PT.      Follow Up Recommendations  (Continue with home health, transfer to outpatient later)    Equipment Recommendations  Rolling walker with 5" wheels    Recommendations for Other Services       Precautions / Restrictions Precautions Precautions: Fall Restrictions Weight Bearing Restrictions: No      Mobility  Bed Mobility               General bed mobility comments: Pt up in recliner and returned to recliner. Bed mobility not assessed.   Transfers Overall transfer level: Modified independent Equipment used: Straight cane             General transfer comment: Pt able to perform sit -> stand with min assist for support with single point cane. Pt is able to perform stand -> sit with CGA for safety   Ambulation/Gait Ambulation/Gait assistance: Min assist Ambulation Distance (Feet): 150 Feet Assistive device: Rolling walker (2 wheeled);Straight cane Gait Pattern/deviations:  Step-through pattern;Decreased dorsiflexion - right Gait velocity: reduced   General Gait Details: Patient ambulated first 20 ft with single point at [place], and then the remainder of the 150 ft with a RW at [facility] for safety. Pt stated he prefered the RW.   Stairs            Wheelchair Mobility    Modified Rankin (Stroke Patients Only)       Balance                                             Pertinent Vitals/Pain Pain Assessment: No/denies pain    Home Living Family/patient expects to be discharged to:: Private residence Living Arrangements: Children Available Help at Discharge: Family Type of Home: House Home Access: Stairs to enter   Technical brewer of Steps: 5 Home Layout: One level Home Equipment: [place] - quad;Cane - single point;Shower seat Additional Comments: Family members are able to drive patient to therapy     Prior Function Level of Independence: Independent with assistive device(s)               Hand Dominance   Dominant Hand: Right    Extremity/Trunk Assessment   Upper Extremity Assessment: RUE deficits/detail RUE Deficits / Details: RUE MMT grossly 3+/5         Lower Extremity Assessment: RLE deficits/detail RLE Deficits / Details: RLE grossly 3+/5 MMT  Communication   Communication: No difficulties  Cognition Arousal/Alertness: Awake/alert Behavior During Therapy: WFL for tasks assessed/performed Overall Cognitive Status: Within Functional Limits for tasks assessed                      General Comments      Exercises Other Exercises Other Exercises: Pt performed AROM of 10 sets of ankle pumps/dorsiflexion, LAQ, chair push-ups, and scapular retraction. Performed at supervision for technique. LAQ on right side required min assist initially to achieve full extension       Assessment/Plan    PT Assessment Patient needs continued PT services  PT Diagnosis Difficulty walking;Abnormality of  gait;Generalized weakness;Hemiplegia dominant side   PT Problem List Decreased strength;Decreased balance;Decreased mobility;Decreased knowledge of use of DME  PT Treatment Interventions Therapeutic exercise;Patient/family education;Gait training   PT Goals (Current goals can be found in the Care Plan section) Acute Rehab PT Goals Patient Stated Goal: to get back to walking PT Goal Formulation: With patient Time For Goal Achievement: 02/26/15 Potential to Achieve Goals: Good    Frequency 7X/week   Barriers to discharge        Co-evaluation               End of Session Equipment Utilized During Treatment: Gait belt Activity Tolerance: Patient tolerated treatment well Patient left: in chair;with call bell/phone within reach;with chair alarm set;with nursing/sitter in room Nurse Communication: Mobility status         Time: 4540-9811 PT Time Calculation (min) (ACUTE ONLY): 36 min   Charges:         PT G CodesJanyth Contes 03-09-15, 3:45 PM  Janyth Contes, SPT. [phone]

## 2015-02-12 NOTE — Plan of Care (Signed)
Problem: SLP Dysphagia Goals Goal: Misc Dysphagia Goal Pt will safely tolerate po diet of least restrictive consistency w/ no overt s/s of aspiration noted by Staff/pt/family x3 sessions.    

## 2015-02-12 NOTE — Care Management (Signed)
There have been "reports" that patient's daughter Ms Chrissie Noa has made an application for [facility].  Spoke with [facility] and there is no application on record.  Spoke with patient's daughter and she confirms that no application has been initiated.  At present, this would not be an immediate solution post discharge.  Patient has significant carotid stenosis.  Patient may be candidate for acute inpatient rehab but he does not have adequate home support at present.  The other option would be skilled nursing.  PT and OT evals are pending.  Spoke with attending regarding mRI

## 2015-02-12 NOTE — Progress Notes (Signed)
Patient ID: Dillon [name], male   DOB: 11-Jun-1946, 69 y.o.   MRN: [id number]  Spoke with Dr Irish Elders neurologist about this patient earlier today.  Spoke with Patient about MRI brain findings of acute stroke originating from posterior circulation and aspirin and plavix dual therapy.  Spoke with Patient about carotid plaque- which is not enough to be a surgical candidate at this time, but need to be followed as outpatient.  Dr Loletha Grayer Another 5 minutes spent on care today.

## 2015-02-13 DIAGNOSIS — I255 Ischemic cardiomyopathy: Secondary | ICD-10-CM

## 2015-02-13 DIAGNOSIS — L8990 Pressure ulcer of unspecified site, unspecified stage: Secondary | ICD-10-CM

## 2015-02-13 DIAGNOSIS — I6509 Occlusion and stenosis of unspecified vertebral artery: Secondary | ICD-10-CM

## 2015-02-13 DIAGNOSIS — I6529 Occlusion and stenosis of unspecified carotid artery: Secondary | ICD-10-CM

## 2015-02-13 MED ORDER — DOCUSATE SODIUM 100 MG PO CAPS: 100.0000 mg | ORAL_CAPSULE | Freq: Two times a day (BID) | ORAL | Status: DC

## 2015-02-13 MED ORDER — ATORVASTATIN CALCIUM 40 MG PO TABS: 80.0000 mg | ORAL_TABLET | Freq: Every day | ORAL | Status: AC

## 2015-02-13 MED ORDER — LISINOPRIL 20 MG PO TABS: 20.0000 mg | ORAL_TABLET | Freq: Every day | ORAL | Status: DC

## 2015-02-13 MED ORDER — CLOPIDOGREL BISULFATE 75 MG PO TABS: 75.0000 mg | ORAL_TABLET | Freq: Every day | ORAL | Status: DC

## 2015-02-13 NOTE — Progress Notes (Signed)
Patient ID: Dillon [name], male   DOB: 1946-02-02, 69 y.o.   MRN: [id number] [facility] PROGRESS NOTE  HPI/Subjective: Patient with difficulty getting his words out as he would like. He knows what he wants to say just can't say it as well as he would like. Some slurred speech. Weakness on the right side. Some cough. No difficulty swallowing. Wants o go home today  Objective: Filed Vitals:   02/13/15 0748  BP: 117/69  Pulse: 98  Temp: 98 F (36.7 C)  Resp: 18    Intake/Output Summary (Last 24 hours) at 02/13/15 1015 Last data filed at 02/13/15 0935  Gross per 24 hour  Intake      0 ml  Output   1725 ml  Net  -1725 ml   Filed Weights   02/11/15 0939 02/12/15 0505 02/13/15 0419  Weight: 91.627 kg (202 lb) 93.486 kg (206 lb 1.6 oz) 93.26 kg (205 lb 9.6 oz)    ROS: Review of Systems  Constitutional: Negative for fever and chills.  Eyes: Negative for blurred vision.  Respiratory: Positive for cough. Negative for sputum production and shortness of breath.   Cardiovascular: Negative for chest pain.  Gastrointestinal: Negative for nausea, vomiting, abdominal pain, diarrhea and constipation.  Genitourinary: Negative for dysuria.  Musculoskeletal: Negative for joint pain.  Neurological: Positive for focal weakness and weakness. Negative for dizziness and headaches.   Exam: Physical Exam  Constitutional: He is oriented to person, place, and time.  HENT:  Nose: No mucosal edema.  Mouth/Throat: No oropharyngeal exudate or posterior oropharyngeal edema.  Eyes: Conjunctivae, EOM and lids are normal. Pupils are equal, round, and reactive to light.  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  Neck: No JVD present. Carotid bruit is not present. No edema present. No thyroid mass and no thyromegaly present.  Cardiovascular: S1 normal and S2 normal.  Exam reveals no gallop.   No murmur heard. Pulses:      Dorsalis pedis pulses are 2+ on the right side, and 2+ on the  left side.  Respiratory: No respiratory distress. He has no wheezes. He has no rhonchi. He has no rales.  GI: Soft. Bowel sounds are normal. There is no tenderness.  Musculoskeletal:       Right ankle: He exhibits swelling.       Left ankle: He exhibits swelling.  Lymphadenopathy:    He has no cervical adenopathy.  Neurological: He is alert and oriented to person, place, and time.  Right-sided weakness 4 out of 5 upper and lower extremity. Positive for slurred speech and right facial droop.  Skin: Skin is warm. No rash noted. Nails show no clubbing.  Psychiatric: He has a normal mood and affect.    Data Reviewed: Basic Metabolic Panel:  Recent Labs Lab 02/11/15 1014 02/12/15 0601  NA 141 140  K 4.2 4.2  CL 108 105  CO2 26 27  GLUCOSE 121* 110*  BUN 16 19  CREATININE 0.96 1.09  CALCIUM 9.5 9.2   Liver Function Tests:  Recent Labs Lab 02/11/15 1014  AST 27  ALT 16*  ALKPHOS 131*  BILITOT 1.1  PROT 7.3  ALBUMIN 3.8   CBC:  Recent Labs Lab 02/11/15 1014 02/12/15 0601  WBC 6.3 6.9  HGB 14.1 14.0  HCT 42.6 41.7  MCV 87.3 86.4  PLT 125* 124*    Studies: Dg Chest 1 View  02/11/2015   CLINICAL DATA:  Weakness, altered mental status  EXAM: CHEST  1 VIEW  COMPARISON:  01/20/2015  FINDINGS: Cardiomegaly is noted with slight progression from prior exam. Status post CABG. No acute infiltrate or pleural effusion. No pulmonary edema.  IMPRESSION: No active disease. Status post CABG. Cardiomegaly with progression from prior exam.   Electronically Signed   By: Lahoma Crocker M.D.   On: 02/11/2015 11:20   Ct Head Wo Contrast  02/11/2015   CLINICAL DATA:  Weakness, right-sided, with slurred speech since Friday. Altered mental status.  EXAM: CT HEAD WITHOUT CONTRAST  TECHNIQUE: Contiguous axial images were obtained from the base of the skull through the vertex without intravenous contrast.  COMPARISON:  None.  FINDINGS: Skull and Sinuses:Negative for fracture or destructive  process. The mastoids, middle ears, and imaged paranasal sinuses are clear.  Orbits: No acute abnormality.  Brain: Approximately 1 cm low-density in the left posterior pons. Given provided history this may reflect an acute to subacute infarct with corticospinal and bulbar deficits. There is also a remote appearing lacunar infarct in the right caudate head. Cerebral volume is within normal limits for age. No evidence of cortical infarct. No hemorrhage, hydrocephalus, or mass lesion.  These results were called by telephone at the time of interpretation on 02/11/2015 at 10:49 am to Dr. Larae Grooms , who verbally acknowledged these results.  IMPRESSION: 1. Low-density in the left pons, suspect recent infarct given the history. MRI could confirm. 2. 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   Electronically Signed   By: Monte Fantasia M.D.   On: 02/11/2015 10:52   Ct Angio Neck W/cm &/or Wo/cm  02/12/2015   CLINICAL DATA:  Right-sided weakness, slurred speech, and dysarthria beginning 1 day ago. Stroke. Left carotid stenosis on ultrasound.  EXAM: CT ANGIOGRAPHY NECK  TECHNIQUE: Multidetector CT imaging of the neck was performed using the standard protocol during bolus administration of intravenous contrast. Multiplanar CT image reconstructions and MIPs were obtained to evaluate the vascular anatomy. Carotid stenosis measurements (when applicable) are obtained utilizing NASCET criteria, using the distal internal carotid diameter as the denominator.  CONTRAST:  66mL OMNIPAQUE IOHEXOL 350 MG/ML SOLN  COMPARISON:  Carotid Doppler ultrasound 02/11/2015  FINDINGS: Aortic arch: 3 vessel aortic arch. Mild, predominantly noncalcified plaque in the proximal brachiocephalic artery without stenosis. Mild narrowing of the right subclavian artery origin by eccentric plaque. No left subclavian artery stenosis.  Right carotid system: Common carotid artery is patent with focal, eccentric, predominantly noncalcified  plaque just proximal to the bifurcation which results in less than 50% stenosis. Moderate calcified plaque at the carotid bifurcation does not result in significant stenosis. Mid cervical ICA is tortuous.  Left carotid system: Common carotid artery is patent without proximal stenosis. There is rather extensive, predominantly noncalcified plaque in the distal common carotid artery extending into the proximal ICA. This results in approximately 65% stenosis of the distal common carotid artery and approximately 50% stenosis of the ICA origin. Remainder of the cervical ICA is unremarkable.  Vertebral arteries: Vertebral arteries are patent with the right being mildly dominant. Focal plaque at the right vertebral artery origin does not result in significant stenosis. There is less than 50% narrowing of the distal right V3 segment. There is mild stenosis of the proximal right V4 segment due to prominent calcified plaque. There is a segmental occlusion or possibly extremely high-grade stenosis involving the distal right V4 segment/vertebrobasilar junction spanning 5 mm (a patent lumen is not clearly identified). The left vertebral artery ends in PICA. Portions of the left  V1 and proximal V2 segments are not well evaluated due to adjacent dense venous contrast. There is likely at least a moderate stenosis of the left vertebral artery at its origin. The visualized portion of the basilar artery appears diffusely irregular with moderate stenosis in its midportion.  Skeleton: Moderate multilevel cervical disc degeneration, greatest from C4-5 to C6-7 with suspected moderate spinal stenosis at these levels. Asymmetric, severe left-sided facet arthrosis is noted at C2-3 and C3-4 with severe left neural foraminal stenosis at C3-4.  Other neck: Left pleural effusion is partially visualized, likely small to moderate in size. Sequelae of prior CABG are partially visualized.  IMPRESSION: 1. Prominent plaque in the left carotid bulb with  65% stenosis of the distal common carotid artery and 50% stenosis of the ICA origin. 2. Moderate plaque at the right carotid bifurcation without significant stenosis. 3. Apparent 5 mm long occlusion of the distal most aspect of the dominant right vertebral artery/vertebrobasilar junction. Left vertebral artery ends in PICA with a likely moderate stenosis of its origin. 4. Left pleural effusion.   Electronically Signed   By: Logan Bores   On: 02/12/2015 10:44   Mr Brain Wo Contrast  02/12/2015   CLINICAL DATA:  69 year old hypertensive male with history of hyperlipidemia and acute infarct presenting with right-sided weakness, speech issues and weakness in the legs with some falls over the past 5 days. Subsequent encounter.  EXAM: MRI HEAD WITHOUT CONTRAST  TECHNIQUE: Multiplanar, multiecho pulse sequences of the brain and surrounding structures were obtained without intravenous contrast.  COMPARISON:  02/12/2015 CT angiogram head and neck. 02/11/2015 head CT. No comparison brain MR.  FINDINGS: Acute nonhemorrhagic left paracentral pontine infarct.  Acute small infarct at the junction of the inferior aspect of the left temporal-occipital lobe.  Mild small vessel disease type changes.  No intracranial hemorrhage.  Global atrophy without hydrocephalus.  No intracranial mass lesion noted on this unenhanced exam.  Left vertebral artery is small appearing to end in a posterior inferior cerebellar artery distribution with superimposed atherosclerotic type changes. Narrowing of the distal right vertebral artery and basilar artery. Internal carotid arteries are patent.  Mild spinal stenosis C3-4. Cervical medullary junction, pituitary region, pineal region and orbital structures unremarkable.  IMPRESSION: Acute nonhemorrhagic left paracentral pontine infarct.  Acute small infarct at the junction of the inferior aspect of the left temporal-occipital lobe.  Mild small vessel disease type changes.  No intracranial  hemorrhage.  Global atrophy without hydrocephalus.  No intracranial mass lesion noted on this unenhanced exam.  Left vertebral artery is small appearing to end in a posterior inferior cerebellar artery distribution with superimposed atherosclerotic type changes. Narrowing of the distal right vertebral artery and basilar artery.   Electronically Signed   By: Genia Del M.D.   On: 02/12/2015 11:04   US Carotid Bilateral  02/11/2015   CLINICAL DATA:  Cerebrovascular accident.  EXAM: BILATERAL CAROTID DUPLEX ULTRASOUND  TECHNIQUE: Pearline Cables scale imaging, color Doppler and duplex ultrasound were performed of bilateral carotid and vertebral arteries in the neck.  COMPARISON:  None.  FINDINGS: Criteria: Quantification of carotid stenosis is based on velocity parameters that correlate the residual internal carotid diameter with NASCET-based stenosis levels, using the diameter of the distal internal carotid lumen as the denominator for stenosis measurement.  The following velocity measurements were obtained:  RIGHT  ICA:  96/19 cm/sec  CCA:  95/62 cm/sec  SYSTOLIC ICA/CCA RATIO:  0.9  DIASTOLIC ICA/CCA RATIO:  2.7  ECA:  127 cm/sec  LEFT  ICA:  175/36 cm/sec  CCA:  53/66 cm/sec  SYSTOLIC ICA/CCA RATIO:  2.4  DIASTOLIC ICA/CCA RATIO:  2.9  ECA:  166 cm/sec  RIGHT CAROTID ARTERY: Mild calcified plaque is noted in distal right common carotid artery and bulb which extends into the proximal right internal carotid artery consistent with less than 50% diameter stenosis based on ultrasound and Doppler criteria.  RIGHT VERTEBRAL ARTERY:  Antegrade flow is noted.  LEFT CAROTID ARTERY: Large amount of eccentric plaque formation is noted in the left carotid bulb which appears to be partially calcified and slightly irregular which extends into proximal left internal carotid artery. It demonstrates peak systolic velocity of 440 centimeters/second within the bulb which is concerning for greater than 70% diameter stenosis based on  ultrasound and Doppler criteria.  LEFT VERTEBRAL ARTERY:  Antegrade flow is noted.  IMPRESSION: Mild calcified plaque is noted in the proximal right internal carotid artery which does not appear to be hemodynamically significant.  Large amount of eccentric, partially calcified and slightly irregular plaque is noted in the left carotid bulb which extends into the proximal left internal carotid artery. Based on peak systolic velocity measured within the bulb, this is greater than 70% diameter stenosis based on ultrasound and Doppler criteria. CT angiography may be performed for further evaluation.   Electronically Signed   By: Marijo Conception, M.D.   On: 02/11/2015 18:15    Scheduled Meds: . aspirin  325 mg Oral Daily  . atorvastatin  40 mg Oral q1800  . clopidogrel  75 mg Oral Daily  . docusate sodium  100 mg Oral BID  . furosemide  40 mg Oral BID  . heparin  5,000 Units Subcutaneous 3 times per day  . lisinopril  20 mg Oral Daily  . metoprolol tartrate  25 mg Oral BID  . pantoprazole  40 mg Oral Daily  . potassium chloride SA  20 mEq Oral Daily  . sodium chloride  3 mL Intravenous Q12H  . spironolactone  25 mg Oral Daily   Continuous Infusions:   Assessment/Plan:  1. 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 Found to have carotid stenosis on ultrasound of the carotids. Had continued telemetry monitoring to rule out arrhythmia while in the hospital but only PVCs were noted. No atrial fibrillation, V. tach or alike. MRI of the brain showed stroke in 2 areas , left pontine infarct as well as left temporal occipital lobe, echocardiogram showed ejection fraction of 20-25%, and uses of anteroseptal myocardium as well as lateral myocardium and inferior myocardium as well as apical myocardium. Moderate mitral valve regurgitation and moderate tricuspid regurgitation , CT angiogram of the carotids showed prominent plaque in left carotid bulb the 65% stenosis of distal common carotid artery and  50% stenosis of ICA origin, right side did not show significant stenosis, also occlusion of abdominal right vertebral artery and some stenosis of left vertebral artery. Continue home physical therapy and occupational therapy and speech therapy . Continue aspirin and Plavix , atorvastatin. Neuro consultation appreciated, follow-up with vascular surgery as outpatient 2. essential hypertension continue current doses of blood  pressure  medications. Need to avoid blood pressure getting too low. advanced her usual home doses as outpatient in the next 1 week  3. Hyperlipidemia unspecified continue atorvastatin and  lipid profile observed with LDL 90 on and HDL 31 . advance Lipitor  4. History of congestive heart failure- patient is not in congestive heart failure at this time. 5. Gastroesophageal reflux  disease without esophagitis- on omeprazole at home, Protonix while here.  Code Status:     Code Status Orders        Start     Ordered   02/11/15 1358  Full code   Continuous     02/11/15 1357     Disposition Plan: home health physical,  speech therapy consultations. Social Engineer, maintenance (IT),  Occupationaltherapy, ide as well as Therapist, sports. Discussed with care management   Time spent 45 minutes  [facility]

## 2015-02-13 NOTE — Plan of Care (Signed)
Problem: Consults Goal: Skin Care Protocol Initiated - if Braden Score 18 or less If consults are not indicated, leave blank or document N/A  Patient is A&O X4, denied pain and does not appear to be in any distress. He was able to transfer from chair to bed with one person assist. Patient was able to swallow his meds without any difficulty. VS remained WDL for patient. Patient was offered bathroom with safety rounding, bed was kept low with bed alarm on and patient needed item within patient's reach.

## 2015-02-13 NOTE — Progress Notes (Signed)
Speech Language Pathology Treatment: Dysphagia  Patient Details Name: Dillon [name] MRN: [id number] DOB: 16-Jul-1946 Today's Date: 02/13/2015 Time: 2992-4268 SLP Time Calculation (min) (ACUTE ONLY): 53 min  Assessment / Plan / Recommendation Clinical Impression  Pt appears to be tolerating his current mech soft diet w/ thin liquids following general aspiration precautions. He appears at reduced risk for aspiration at this time monitoring his oral mastication needs of foods sec. To his edentulous status and being careful when drinking thin liquids sec. To inconsistent, anterior leakage d/t decreased labial tone/stregnth. Pt continues to present w/ Dysarthria w/ min. Decreased articulation of speech impairing intelligibility. Reviewed general OMEs targeting lingual and labial exs. And gave overarticulation tasks for pt to begin with. As pt is discharging home today, rec. F/u w/ skilled ST services for Dysarthria and any further expressive language assessment indicated to improve verbal communication for ADLs. Handouts of exs. And precautions reviewed w/ pt and friend; pt agreed.   HPI Other Pertinent Information: Pt denied any trouble swallowing prior to admission to the hospital. He indicated that since "this happened", he has felt some s/s of change in his talking and liquids dribbling from his mouth on the R side for ~2-3 days. He felt he knew what he wanted to say but paused sometimes to "get the words right". He indicated his speech clarity was unchanged; however, noted mild+ dysarthria and hyponasality. Pt was intelligible to follow along in conversation. Pt indicated R UE weakness in 2 fingers of his hand as well.  Pt continues to present w/ Dysarthria of speech but is able to demo. increased orolabial movements w/ increased effort of exertion during OM tasks and exagerated speech.    Pertinent Vitals Pain Assessment: No/denies pain  SLP Plan  Continue with current plan of care     Recommendations Diet recommendations: Dysphagia 3 (mechanical soft);Thin liquid Liquids provided via: Cup Medication Administration: Whole meds with puree Supervision: Patient able to self feed (needs setup) Compensations: Slow rate;Small sips/bites (chew foods well) Postural Changes and/or Swallow Maneuvers: Seated upright 90 degrees              General recommendations: Rehab consult (Home Health f/u for OMEs; Dysarthria ) Oral Care Recommendations: Oral care BID;Patient independent with oral care;Oral care before and after PO Follow up Recommendations: Home health SLP Plan: Continue with current plan of care    GO     [facility] 02/13/2015, 6:39 PM

## 2015-02-13 NOTE — Discharge Instructions (Signed)
His do not forget to take only 20 mg of lisinopril for the next 7 days and then resume usual lisinopril doses,  advance Lipitor to 80 mg daily dose.

## 2015-02-13 NOTE — Progress Notes (Signed)
PATIENT IS DISCHARGE IN A STABLE CONDITION , SUMMARY AND F/U CARE GIVEN TO PATIENT VERBALIZED UNDERSTANDING , LEFT WITH A FRIEND

## 2015-02-13 NOTE — Discharge Summary (Signed)
[place] [facility]   PATIENT NAME: Dillon [name]    MR#:  [id number]  DATE OF BIRTH:  11/29/1945  DATE OF ADMISSION:  02/11/2015 ADMITTING PHYSICIAN: Theodoro Grist, MD  DATE OF DISCHARGE: 02/13/2015  2:35 PM  PRIMARY CARE PHYSICIAN: No PCP Per Patient     ADMISSION DIAGNOSIS:  CVA (cerebral vascular accident) [I63.9] CVA (cerebral infarction) [I63.9]  DISCHARGE DIAGNOSIS:  Principal Problem:   Right hemiparesis Active Problems:   CVA (cerebral infarction)   HTN (hypertension)   Pressure ulcer   Carotid artery stenosis   Occlusion and stenosis of vertebral artery   Cardiomyopathy, ischemic   CAD (coronary artery disease)   SECONDARY DIAGNOSIS:   Past Medical History  Diagnosis Date  . Acute MI   . Coronary artery disease   . CHF (congestive heart failure)   . Hypertension   . Hyperlipidemia     .pro HOSPITAL COURSE:  Johan Creveling is a 69 y.o. male with a known history of medical problems including chronic systolic CHF coronary artery disease status post MI and status post coronary bypass grafting history of chronic dyspnea, hypertension and hyperlipidemia who presents to the hospital with complaints of 4-5 day history of right-sided weakness. The patient's daughter was present databank and interviewed patient started having symptoms of such as headaches as well as slurry speech on Saturday 3 days ago. 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 This patient CT of head revealed left pons injury, suspected stroke hospitalist services were contacted for admission Patient was admitted to the hospital and stroke work up was performed. He underwent MRI of head, CT angiogram of neck arteries, carotid  Doppler US, Echo . He was seen by  Neurologist and initiated on asa, Plavix PO. Lipid panel was performed and statin was advanced. Discussion by a problem: 1. 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 Found to have carotid stenosis on ultrasound of the carotids. Had continued telemetry monitoring to rule out arrhythmia while in the hospital but only PVCs were noted. No atrial fibrillation, V. tach or alike. MRI of the brain showed stroke in 2 areas , left pontine infarct as well as left temporal occipital lobe, echocardiogram showed ejection fraction of 20-25%, and uses of anteroseptal myocardium as well as lateral myocardium and inferior myocardium as well as apical myocardium. Moderate mitral valve regurgitation and moderate tricuspid regurgitation , CT angiogram of the carotids showed prominent plaque in left carotid bulb the 65% stenosis of distal common carotid artery and 50% stenosis of ICA origin, right side did not show significant stenosis, also occlusion of abdominal right vertebral artery and some stenosis of left vertebral artery. Continue home physical therapy and occupational therapy and speech therapy . Continue aspirin and Plavix , atorvastatin. Neuro consultation appreciated, follow-up with vascular surgery as outpatient needed.  2. essential hypertension continue lower dose of lisinopril for the next 1 week, then advance to prior home dose of blood pressure medications. Need to avoid blood pressure getting too low. Follow up with PCP in the next 2-3 days for recommendations about BP meds 3. Hyperlipidemia unspecified continue atorvastatin and lipid profile observed with LDL 90 on and HDL 31 . advance Lipitor to 80 mg daily 4. History of chronic systolic congestive heart  failure- patient is not in congestive heart failure at this time.Lasix, lisinopril 5. Gastroesophageal reflux disease without esophagitis- on omeprazole at home, Protonix while here. DISCHARGE CONDITIONS:    stable  CONSULTS OBTAINED:  Treatment Team:  Leotis Pain, MD  DRUG ALLERGIES:  No Known Allergies  DISCHARGE MEDICATIONS:   Discharge Medication List as of 02/13/2015  1:43 PM    START taking these medications   Details  clopidogrel (PLAVIX) 75 MG tablet Take 1 tablet (75 mg total) by mouth daily., Starting 02/13/2015, Until Discontinued, Normal    docusate sodium (COLACE) 100 MG capsule Take 1 capsule (100 mg total) by mouth 2 (two) times daily., Starting 02/13/2015, Until Discontinued, Normal      CONTINUE these medications which have CHANGED   Details  atorvastatin (LIPITOR) 40 MG tablet Take 2 tablets (80 mg total) by mouth daily at 6 PM., Starting 02/13/2015, Until Discontinued, Print    lisinopril (PRINIVIL,ZESTRIL) 20 MG tablet Take 1 tablet (20 mg total) by mouth daily., Starting 02/13/2015, Until Discontinued, Normal      CONTINUE these medications which have NOT CHANGED   Details  aspirin EC 81 MG EC tablet Take 1 tablet (81 mg total) by mouth daily., Starting 01/23/2015, Until Discontinued, Print    furosemide (LASIX) 40 MG tablet Take 1 tablet (40 mg total) by mouth 2 (two) times daily., Starting 01/23/2015, Until Discontinued, Normal    isosorbide mononitrate (IMDUR) 30 MG 24 hr tablet Take 1 tablet (30 mg total) by mouth daily., Starting 01/23/2015, Until Discontinued, Print    metoprolol tartrate (LOPRESSOR) 25 MG tablet Take 25 mg by mouth 2 (two) times daily., Until Discontinued, Historical Med    nitroGLYCERIN (NITROSTAT) 0.4 MG SL tablet Place 1 tablet (0.4 mg total) under the tongue every 5 (five) minutes as needed. For chest pain. May take up to 3 doses., Starting 02/08/2015, Until Discontinued, Normal    omeprazole (PRILOSEC) 20 MG capsule Take 20 mg by mouth daily., Until Discontinued, Historical Med    Potassium Chloride ER 20 MEQ TBCR Take 20 mEq by mouth daily., Starting 01/23/2015, Until Discontinued, Print    spironolactone (ALDACTONE) 25 MG tablet  Take 1 tablet (25 mg total) by mouth daily., Starting 01/23/2015, Until Discontinued, Print         DISCHARGE INSTRUCTIONS:    Follow up with PCP, vascular surgery. Take 20 mg of lisinopril for the next 1 week, then advance to 40 mg daily dose  If you experience worsening of your admission symptoms, develop shortness of breath, life threatening emergency, suicidal or homicidal thoughts you must seek medical attention immediately by calling 911 or calling your MD immediately  if symptoms less severe.  You Must read complete instructions/literature along with all the possible adverse reactions/side effects for all the Medicines you take and that have been prescribed to you. Take any new Medicines after you have completely understood and accept all the possible adverse reactions/side effects.   Please note  You were cared for by a hospitalist during your hospital stay. If you have any questions about your discharge medications or the care you received while you were in the hospital after you are discharged, you can call the unit and asked to speak with the hospitalist on call if the hospitalist that took care of you is not available. Once you are discharged, your primary care physician will handle any further medical issues. Please note that NO REFILLS for any discharge medications will be authorized once you are discharged, as it  is imperative that you return to your primary care physician (or establish a relationship with a primary care physician if you do not have one) for your aftercare needs so that they can reassess your need for medications and monitor your lab values.    Today   CHIEF COMPLAINT:   Chief Complaint  Patient presents with  . Weakness  . Altered Mental Status    HISTORY OF PRESENT ILLNESS:  Emett Stapel  is a 69 y.o. male with a known history of ischaemic cardiomyopathy, EF 20-25%, HTN, hyperlipidmeia, CAD, MI and status post coronary bypass grafting history of  chronic dyspnea, hypertension and hyperlipidemia who presents to the hospital with complaints of 4-5 day history of right-sided weakness. The patient's daughter was present databank and interviewed patient started having symptoms of such as headaches as well as slurry speech on Saturday 3 days ago. 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 This patient CT of head revealed left pons injury, suspected stroke hospitalist services were contacted for admission Patient was admitted to the hospital and stroke work up was performed. He underwent MRI of head, CT angiogram of neck arteries, carotid Doppler US, Echo . He was seen by  Neurologist and initiated on asa, Plavix PO. Lipid panel was performed and statin was advanced. Discussion by a problem: 6. 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 Found to have carotid stenosis on ultrasound of the carotids. Had continued telemetry monitoring to rule out arrhythmia while in the hospital but only PVCs were noted. No atrial fibrillation, V. tach or alike. MRI of the brain showed stroke in 2 areas , left pontine infarct as well as left temporal occipital lobe, echocardiogram showed ejection fraction of 20-25%, and uses of anteroseptal myocardium as well as lateral myocardium and inferior myocardium as well as apical myocardium. Moderate mitral valve regurgitation and moderate tricuspid regurgitation , CT angiogram of the carotids showed prominent plaque in left carotid bulb the 65% stenosis of distal common carotid artery and 50% stenosis of ICA origin, right side did not show significant stenosis, also occlusion of abdominal right vertebral artery and some stenosis of left vertebral artery. Continue home physical therapy and  occupational therapy and speech therapy . Continue aspirin and Plavix , atorvastatin. Neuro consultation appreciated, follow-up with vascular surgery as outpatient needed.  7. essential hypertension continue lower dose of lisinopril for the next 1 week, then advance to prior home dose of blood pressure medications. Need to avoid blood pressure getting too low. Follow up with PCP in the next 2-3 days for recommendations about BP meds 8. Hyperlipidemia unspecified continue atorvastatin and lipid profile observed with LDL 90 on and HDL 31 . advance Lipitor to 80 mg daily 9. History of chronic systolic congestive heart failure- patient is not in congestive heart failure at this time.Lasix, lisinopril 10. Gastroesophageal reflux disease without esophagitis- on omeprazole at home, Protonix while here. DISCHARGE CONDITIONS:   stable     VITAL SIGNS:  Blood pressure 131/67, pulse 55, temperature 97.8 F (36.6 C), temperature source Oral, resp. rate 18, height 5\' 9"  (1.753 m), weight 93.26 kg (205 lb 9.6 oz), SpO2 100 %.  I/O:   Intake/Output Summary (Last 24 hours) at 02/13/15 1632 Last data filed at 02/13/15 1217  Gross per 24  hour  Intake    240 ml  Output   1776 ml  Net  -1536 ml    PHYSICAL EXAMINATION:  GENERAL:  69 y.o.-year-old patient lying in the bed with no acute distress.  EYES: Pupils equal, round, reactive to light and accommodation. No scleral icterus. Extraocular muscles intact.  HEENT: Head atraumatic, normocephalic. Oropharynx and nasopharynx clear.  NECK:  Supple, no jugular venous distention. No thyroid enlargement, no tenderness.  LUNGS: Normal breath sounds bilaterally, no wheezing, rales,rhonchi or crepitation. No use of accessory muscles of respiration.  CARDIOVASCULAR: S1, S2 normal. No murmurs, rubs, or gallops.  ABDOMEN: Soft, non-tender, non-distended. Bowel sounds present. No organomegaly or mass.  EXTREMITIES: No pedal edema, cyanosis, or clubbing.   NEUROLOGIC: Cranial nerves II through XII are intact. Muscle strength 4/5 in right sided extremities, 5/5 on the left. Sensation intact. Gait not checked. R facial weakness, some slurring of speech, expressive aphasia. PSYCHIATRIC: The patient is alert and oriented x 3.  SKIN: No obvious rash, lesion, or ulcer.   DATA REVIEW:   CBC  Recent Labs Lab 02/12/15 0601  WBC 6.9  HGB 14.0  HCT 41.7  PLT 124*    Chemistries   Recent Labs Lab 02/11/15 1014 02/12/15 0601  NA 141 140  K 4.2 4.2  CL 108 105  CO2 26 27  GLUCOSE 121* 110*  BUN 16 19  CREATININE 0.96 1.09  CALCIUM 9.5 9.2  AST 27  --   ALT 16*  --   ALKPHOS 131*  --   BILITOT 1.1  --     Cardiac Enzymes No results for input(s): TROPONINI in the last 168 hours.  Microbiology Results  Results for orders placed or performed in visit on 01/23/14  Culture, blood (single)     Status: None   Collection Time: 01/23/14 11:54 PM  Result Value Ref Range Status   Micro Text Report   Final       COMMENT                   NO GROWTH AEROBICALLY/ANAEROBICALLY IN 5 DAYS   ANTIBIOTIC                                                      Culture, blood (single)     Status: None   Collection Time: 01/23/19 11:49 PM  Result Value Ref Range Status   Micro Text Report   Final       COMMENT                   NO GROWTH AEROBICALLY/ANAEROBICALLY IN 5 DAYS   ANTIBIOTIC                                                        RADIOLOGY:  Ct Angio Neck W/cm &/or Wo/cm  02/12/2015   CLINICAL DATA:  Right-sided weakness, slurred speech, and dysarthria beginning 1 day ago. Stroke. Left carotid stenosis on ultrasound.  EXAM: CT ANGIOGRAPHY NECK  TECHNIQUE: Multidetector CT imaging of the neck was performed using the standard protocol during bolus administration of intravenous contrast. Multiplanar CT image reconstructions and MIPs were obtained  to evaluate the vascular anatomy. Carotid stenosis measurements (when applicable) are  obtained utilizing NASCET criteria, using the distal internal carotid diameter as the denominator.  CONTRAST:  14mL OMNIPAQUE IOHEXOL 350 MG/ML SOLN  COMPARISON:  Carotid Doppler ultrasound 02/11/2015  FINDINGS: Aortic arch: 3 vessel aortic arch. Mild, predominantly noncalcified plaque in the proximal brachiocephalic artery without stenosis. Mild narrowing of the right subclavian artery origin by eccentric plaque. No left subclavian artery stenosis.  Right carotid system: Common carotid artery is patent with focal, eccentric, predominantly noncalcified plaque just proximal to the bifurcation which results in less than 50% stenosis. Moderate calcified plaque at the carotid bifurcation does not result in significant stenosis. Mid cervical ICA is tortuous.  Left carotid system: Common carotid artery is patent without proximal stenosis. There is rather extensive, predominantly noncalcified plaque in the distal common carotid artery extending into the proximal ICA. This results in approximately 65% stenosis of the distal common carotid artery and approximately 50% stenosis of the ICA origin. Remainder of the cervical ICA is unremarkable.  Vertebral arteries: Vertebral arteries are patent with the right being mildly dominant. Focal plaque at the right vertebral artery origin does not result in significant stenosis. There is less than 50% narrowing of the distal right V3 segment. There is mild stenosis of the proximal right V4 segment due to prominent calcified plaque. There is a segmental occlusion or possibly extremely high-grade stenosis involving the distal right V4 segment/vertebrobasilar junction spanning 5 mm (a patent lumen is not clearly identified). The left vertebral artery ends in PICA. Portions of the left V1 and proximal V2 segments are not well evaluated due to adjacent dense venous contrast. There is likely at least a moderate stenosis of the left vertebral artery at its origin. The visualized portion of  the basilar artery appears diffusely irregular with moderate stenosis in its midportion.  Skeleton: Moderate multilevel cervical disc degeneration, greatest from C4-5 to C6-7 with suspected moderate spinal stenosis at these levels. Asymmetric, severe left-sided facet arthrosis is noted at C2-3 and C3-4 with severe left neural foraminal stenosis at C3-4.  Other neck: Left pleural effusion is partially visualized, likely small to moderate in size. Sequelae of prior CABG are partially visualized.  IMPRESSION: 1. Prominent plaque in the left carotid bulb with 65% stenosis of the distal common carotid artery and 50% stenosis of the ICA origin. 2. Moderate plaque at the right carotid bifurcation without significant stenosis. 3. Apparent 5 mm long occlusion of the distal most aspect of the dominant right vertebral artery/vertebrobasilar junction. Left vertebral artery ends in PICA with a likely moderate stenosis of its origin. 4. Left pleural effusion.   Electronically Signed   By: Logan Bores   On: 02/12/2015 10:44   Mr Brain Wo Contrast  02/12/2015   CLINICAL DATA:  69 year old hypertensive male with history of hyperlipidemia and acute infarct presenting with right-sided weakness, speech issues and weakness in the legs with some falls over the past 5 days. Subsequent encounter.  EXAM: MRI HEAD WITHOUT CONTRAST  TECHNIQUE: Multiplanar, multiecho pulse sequences of the brain and surrounding structures were obtained without intravenous contrast.  COMPARISON:  02/12/2015 CT angiogram head and neck. 02/11/2015 head CT. No comparison brain MR.  FINDINGS: Acute nonhemorrhagic left paracentral pontine infarct.  Acute small infarct at the junction of the inferior aspect of the left temporal-occipital lobe.  Mild small vessel disease type changes.  No intracranial hemorrhage.  Global atrophy without hydrocephalus.  No intracranial mass lesion noted on this unenhanced exam.  Left vertebral artery is small appearing to end in a  posterior inferior cerebellar artery distribution with superimposed atherosclerotic type changes. Narrowing of the distal right vertebral artery and basilar artery. Internal carotid arteries are patent.  Mild spinal stenosis C3-4. Cervical medullary junction, pituitary region, pineal region and orbital structures unremarkable.  IMPRESSION: Acute nonhemorrhagic left paracentral pontine infarct.  Acute small infarct at the junction of the inferior aspect of the left temporal-occipital lobe.  Mild small vessel disease type changes.  No intracranial hemorrhage.  Global atrophy without hydrocephalus.  No intracranial mass lesion noted on this unenhanced exam.  Left vertebral artery is small appearing to end in a posterior inferior cerebellar artery distribution with superimposed atherosclerotic type changes. Narrowing of the distal right vertebral artery and basilar artery.   Electronically Signed   By: Genia Del M.D.   On: 02/12/2015 11:04   US Carotid Bilateral  02/11/2015   CLINICAL DATA:  Cerebrovascular accident.  EXAM: BILATERAL CAROTID DUPLEX ULTRASOUND  TECHNIQUE: Pearline Cables scale imaging, color Doppler and duplex ultrasound were performed of bilateral carotid and vertebral arteries in the neck.  COMPARISON:  None.  FINDINGS: Criteria: Quantification of carotid stenosis is based on velocity parameters that correlate the residual internal carotid diameter with NASCET-based stenosis levels, using the diameter of the distal internal carotid lumen as the denominator for stenosis measurement.  The following velocity measurements were obtained:  RIGHT  ICA:  96/19 cm/sec  CCA:  44/03 cm/sec  SYSTOLIC ICA/CCA RATIO:  0.9  DIASTOLIC ICA/CCA RATIO:  2.7  ECA:  127 cm/sec  LEFT  ICA:  175/36 cm/sec  CCA:  47/42 cm/sec  SYSTOLIC ICA/CCA RATIO:  2.4  DIASTOLIC ICA/CCA RATIO:  2.9  ECA:  166 cm/sec  RIGHT CAROTID ARTERY: Mild calcified plaque is noted in distal right common carotid artery and bulb which extends into the  proximal right internal carotid artery consistent with less than 50% diameter stenosis based on ultrasound and Doppler criteria.  RIGHT VERTEBRAL ARTERY:  Antegrade flow is noted.  LEFT CAROTID ARTERY: Large amount of eccentric plaque formation is noted in the left carotid bulb which appears to be partially calcified and slightly irregular which extends into proximal left internal carotid artery. It demonstrates peak systolic velocity of 595 centimeters/second within the bulb which is concerning for greater than 70% diameter stenosis based on ultrasound and Doppler criteria.  LEFT VERTEBRAL ARTERY:  Antegrade flow is noted.  IMPRESSION: Mild calcified plaque is noted in the proximal right internal carotid artery which does not appear to be hemodynamically significant.  Large amount of eccentric, partially calcified and slightly irregular plaque is noted in the left carotid bulb which extends into the proximal left internal carotid artery. Based on peak systolic velocity measured within the bulb, this is greater than 70% diameter stenosis based on ultrasound and Doppler criteria. CT angiography may be performed for further evaluation.   Electronically Signed   By: Marijo Conception, M.D.   On: 02/11/2015 18:15    EKG:   Orders placed or performed during the hospital encounter of 02/11/15  . ED EKG  . ED EKG      Management plans discussed with the patient, family and they are in agreement.  CODE STATUS:     Code Status Orders        Start     Ordered   02/11/15 1358  Full code   Continuous     02/11/15 1357      TOTAL TIME TAKING CARE  OF THIS PATIENT: 40 minutes.    Theodoro Grist M.D on 02/13/2015 at 4:32 PM  Between 7am to 6pm - Pager - [phone]  After 6pm go to [email or web address] - password [email or web address]  [facility]  Office  [phone]  CC: Primary care physician; No PCP Per Patient

## 2015-02-25 ENCOUNTER — Encounter: Payer: Self-pay | Admitting: Emergency Medicine

## 2015-02-25 ENCOUNTER — Emergency Department: Payer: Medicare Other

## 2015-02-25 ENCOUNTER — Inpatient Hospital Stay
Admission: EM | Admit: 2015-02-25 | Discharge: 2015-02-28 | DRG: 176 | Disposition: A | Discharge status: Skilled Nursing Facility | Payer: Medicare Other | Attending: Internal Medicine | Admitting: Internal Medicine

## 2015-02-25 ENCOUNTER — Inpatient Hospital Stay: Payer: Medicare Other

## 2015-02-25 DIAGNOSIS — F17220 Nicotine dependence, chewing tobacco, uncomplicated: Secondary | ICD-10-CM | POA: Diagnosis present

## 2015-02-25 DIAGNOSIS — E785 Hyperlipidemia, unspecified: Secondary | ICD-10-CM | POA: Diagnosis present

## 2015-02-25 DIAGNOSIS — I252 Old myocardial infarction: Secondary | ICD-10-CM | POA: Diagnosis not present

## 2015-02-25 DIAGNOSIS — Z7982 Long term (current) use of aspirin: Secondary | ICD-10-CM

## 2015-02-25 DIAGNOSIS — R0902 Hypoxemia: Secondary | ICD-10-CM | POA: Diagnosis present

## 2015-02-25 DIAGNOSIS — I5022 Chronic systolic (congestive) heart failure: Secondary | ICD-10-CM | POA: Diagnosis present

## 2015-02-25 DIAGNOSIS — C649 Malignant neoplasm of unspecified kidney, except renal pelvis: Secondary | ICD-10-CM | POA: Diagnosis present

## 2015-02-25 DIAGNOSIS — D6869 Other thrombophilia: Secondary | ICD-10-CM | POA: Diagnosis present

## 2015-02-25 DIAGNOSIS — Z66 Do not resuscitate: Secondary | ICD-10-CM | POA: Diagnosis present

## 2015-02-25 DIAGNOSIS — I69351 Hemiplegia and hemiparesis following cerebral infarction affecting right dominant side: Secondary | ICD-10-CM | POA: Diagnosis not present

## 2015-02-25 DIAGNOSIS — I248 Other forms of acute ischemic heart disease: Secondary | ICD-10-CM | POA: Diagnosis present

## 2015-02-25 DIAGNOSIS — I2510 Atherosclerotic heart disease of native coronary artery without angina pectoris: Secondary | ICD-10-CM | POA: Diagnosis present

## 2015-02-25 DIAGNOSIS — I2699 Other pulmonary embolism without acute cor pulmonale: Principal | ICD-10-CM

## 2015-02-25 DIAGNOSIS — I739 Peripheral vascular disease, unspecified: Secondary | ICD-10-CM | POA: Diagnosis present

## 2015-02-25 DIAGNOSIS — I823 Embolism and thrombosis of renal vein: Secondary | ICD-10-CM

## 2015-02-25 DIAGNOSIS — I6522 Occlusion and stenosis of left carotid artery: Secondary | ICD-10-CM | POA: Diagnosis present

## 2015-02-25 DIAGNOSIS — I10 Essential (primary) hypertension: Secondary | ICD-10-CM | POA: Diagnosis present

## 2015-02-25 DIAGNOSIS — I429 Cardiomyopathy, unspecified: Secondary | ICD-10-CM | POA: Diagnosis present

## 2015-02-25 DIAGNOSIS — N2889 Other specified disorders of kidney and ureter: Secondary | ICD-10-CM | POA: Diagnosis present

## 2015-02-25 DIAGNOSIS — Z79899 Other long term (current) drug therapy: Secondary | ICD-10-CM

## 2015-02-25 DIAGNOSIS — J90 Pleural effusion, not elsewhere classified: Secondary | ICD-10-CM

## 2015-02-25 DIAGNOSIS — H5441 Blindness, right eye, normal vision left eye: Secondary | ICD-10-CM | POA: Diagnosis present

## 2015-02-25 HISTORY — DX: Cerebral infarction, unspecified: I63.9

## 2015-02-25 LAB — URINALYSIS COMPLETE WITH MICROSCOPIC (ARMC ONLY)
Bacteria, UA: NONE SEEN
Bilirubin Urine: NEGATIVE
GLUCOSE, UA: NEGATIVE mg/dL
Ketones, ur: NEGATIVE mg/dL
NITRITE: NEGATIVE
PH: 5 (ref 5.0–8.0)
PROTEIN: NEGATIVE mg/dL
Specific Gravity, Urine: 1.013 (ref 1.005–1.030)
Squamous Epithelial / LPF: NONE SEEN

## 2015-02-25 LAB — CBC
HEMATOCRIT: 44.2 % (ref 40.0–52.0)
Hemoglobin: 14.5 g/dL (ref 13.0–18.0)
MCH: 28.9 pg (ref 26.0–34.0)
MCHC: 32.7 g/dL (ref 32.0–36.0)
MCV: 88.1 fL (ref 80.0–100.0)
Platelets: 120 10*3/uL — ABNORMAL LOW (ref 150–440)
RBC: 5.02 MIL/uL (ref 4.40–5.90)
RDW: 14.7 % — AB (ref 11.5–14.5)
WBC: 7.5 10*3/uL (ref 3.8–10.6)

## 2015-02-25 LAB — BASIC METABOLIC PANEL
ANION GAP: 6 (ref 5–15)
BUN: 25 mg/dL — AB (ref 6–20)
CHLORIDE: 110 mmol/L (ref 101–111)
CO2: 26 mmol/L (ref 22–32)
Calcium: 9.6 mg/dL (ref 8.9–10.3)
Creatinine, Ser: 1.01 mg/dL (ref 0.61–1.24)
Glucose, Bld: 153 mg/dL — ABNORMAL HIGH (ref 65–99)
POTASSIUM: 4.5 mmol/L (ref 3.5–5.1)
SODIUM: 142 mmol/L (ref 135–145)

## 2015-02-25 LAB — TROPONIN I
TROPONIN I: 0.04 ng/mL — AB (ref <0.031)
Troponin I: 0.04 ng/mL — ABNORMAL HIGH (ref <0.031)
Troponin I: 0.04 ng/mL — ABNORMAL HIGH (ref <0.031)

## 2015-02-25 LAB — PROTIME-INR
INR: 1.22
PROTHROMBIN TIME: 15.6 s — AB (ref 11.4–15.0)

## 2015-02-25 LAB — BRAIN NATRIURETIC PEPTIDE: B Natriuretic Peptide: 2563 pg/mL — ABNORMAL HIGH (ref 0.0–100.0)

## 2015-02-25 LAB — HEPARIN LEVEL (UNFRACTIONATED): HEPARIN UNFRACTIONATED: 0.16 [IU]/mL — AB (ref 0.30–0.70)

## 2015-02-25 LAB — APTT: aPTT: 31 seconds (ref 24–36)

## 2015-02-25 MED ORDER — PANTOPRAZOLE SODIUM 40 MG PO TBEC
40.0000 mg | DELAYED_RELEASE_TABLET | Freq: Every day | ORAL | Status: DC
  Administered 2015-02-26 – 2015-02-28 (×3): 40 mg via ORAL
  Filled 2015-02-25 (×3): qty 1

## 2015-02-25 MED ORDER — ATORVASTATIN CALCIUM 20 MG PO TABS
40.0000 mg | ORAL_TABLET | Freq: Every evening | ORAL | Status: DC
  Administered 2015-02-25 – 2015-02-27 (×3): 40 mg via ORAL
  Filled 2015-02-25 (×3): qty 2

## 2015-02-25 MED ORDER — WARFARIN - PHYSICIAN DOSING INPATIENT
Freq: Every day | Status: DC
  Administered 2015-02-25: 18:00:00

## 2015-02-25 MED ORDER — ASPIRIN EC 81 MG PO TBEC
81.0000 mg | DELAYED_RELEASE_TABLET | Freq: Every day | ORAL | Status: DC
  Administered 2015-02-26 – 2015-02-28 (×3): 81 mg via ORAL
  Filled 2015-02-25 (×3): qty 1

## 2015-02-25 MED ORDER — WARFARIN SODIUM 7.5 MG PO TABS
7.5000 mg | ORAL_TABLET | Freq: Every morning | ORAL | Status: DC
  Administered 2015-02-26: 7.5 mg via ORAL
  Filled 2015-02-25: qty 1

## 2015-02-25 MED ORDER — HEPARIN BOLUS VIA INFUSION
2700.0000 [IU] | Freq: Once | INTRAVENOUS | Status: AC
  Administered 2015-02-25: 2700 [IU] via INTRAVENOUS
  Filled 2015-02-25: qty 2700

## 2015-02-25 MED ORDER — WARFARIN SODIUM 7.5 MG PO TABS
7.5000 mg | ORAL_TABLET | Freq: Once | ORAL | Status: AC
  Administered 2015-02-25: 7.5 mg via ORAL
  Filled 2015-02-25: qty 1

## 2015-02-25 MED ORDER — IOHEXOL 350 MG/ML SOLN
100.0000 mL | Freq: Once | INTRAVENOUS | Status: AC | PRN
  Administered 2015-02-25: 100 mL via INTRAVENOUS

## 2015-02-25 MED ORDER — HEPARIN SODIUM (PORCINE) 5000 UNIT/ML IJ SOLN
INTRAMUSCULAR | Status: AC
  Administered 2015-02-25: 5000 [IU]
  Filled 2015-02-25: qty 1

## 2015-02-25 MED ORDER — FUROSEMIDE 40 MG PO TABS
40.0000 mg | ORAL_TABLET | Freq: Two times a day (BID) | ORAL | Status: DC
  Administered 2015-02-25 – 2015-02-28 (×5): 40 mg via ORAL
  Filled 2015-02-25 (×5): qty 1

## 2015-02-25 MED ORDER — ACETAMINOPHEN 650 MG RE SUPP: 650.0000 mg | Freq: Four times a day (QID) | RECTAL | Status: DC | PRN

## 2015-02-25 MED ORDER — PNEUMOCOCCAL VAC POLYVALENT 25 MCG/0.5ML IJ INJ: 0.5000 mL | INJECTION | INTRAMUSCULAR | Status: DC

## 2015-02-25 MED ORDER — SODIUM CHLORIDE 0.9 % IJ SOLN
3.0000 mL | Freq: Two times a day (BID) | INTRAMUSCULAR | Status: DC
  Administered 2015-02-26: 3 mL via INTRAVENOUS

## 2015-02-25 MED ORDER — METOPROLOL TARTRATE 25 MG PO TABS: 25.0000 mg | ORAL_TABLET | Freq: Two times a day (BID) | ORAL | Status: DC

## 2015-02-25 MED ORDER — FUROSEMIDE 10 MG/ML IJ SOLN
40.0000 mg | Freq: Once | INTRAMUSCULAR | Status: AC
  Administered 2015-02-25: 40 mg via INTRAVENOUS

## 2015-02-25 MED ORDER — OXYCODONE HCL 5 MG PO TABS
5.0000 mg | ORAL_TABLET | ORAL | Status: DC | PRN
  Administered 2015-02-25: 5 mg via ORAL
  Filled 2015-02-25: qty 1

## 2015-02-25 MED ORDER — GADOBENATE DIMEGLUMINE 529 MG/ML IV SOLN
20.0000 mL | Freq: Once | INTRAVENOUS | Status: AC | PRN
  Administered 2015-02-25: 20 mL via INTRAVENOUS

## 2015-02-25 MED ORDER — POTASSIUM CHLORIDE ER 20 MEQ PO TBCR
20.0000 meq | EXTENDED_RELEASE_TABLET | Freq: Every day | ORAL | Status: DC
  Filled 2015-02-25 (×2): qty 1

## 2015-02-25 MED ORDER — METOPROLOL TARTRATE 25 MG PO TABS
12.5000 mg | ORAL_TABLET | Freq: Two times a day (BID) | ORAL | Status: DC
  Administered 2015-02-25: 12.5 mg via ORAL
  Filled 2015-02-25: qty 1

## 2015-02-25 MED ORDER — ACETAMINOPHEN 325 MG PO TABS: 650.0000 mg | ORAL_TABLET | Freq: Four times a day (QID) | ORAL | Status: DC | PRN

## 2015-02-25 MED ORDER — HEPARIN (PORCINE) IN NACL 100-0.45 UNIT/ML-% IJ SOLN
1700.0000 [IU]/h | INTRAMUSCULAR | Status: DC
  Administered 2015-02-25: 1500 [IU]/h via INTRAVENOUS
  Administered 2015-02-26: 1800 [IU]/h via INTRAVENOUS
  Administered 2015-02-27: 1700 [IU]/h via INTRAVENOUS
  Filled 2015-02-25 (×9): qty 250

## 2015-02-25 MED ORDER — LISINOPRIL 20 MG PO TABS
40.0000 mg | ORAL_TABLET | Freq: Every day | ORAL | Status: DC
  Administered 2015-02-27 – 2015-02-28 (×2): 40 mg via ORAL
  Filled 2015-02-25 (×2): qty 2

## 2015-02-25 MED ORDER — NITROGLYCERIN 0.4 MG SL SUBL: 0.4000 mg | SUBLINGUAL_TABLET | SUBLINGUAL | Status: DC | PRN

## 2015-02-25 MED ORDER — MORPHINE SULFATE 2 MG/ML IJ SOLN: 2.0000 mg | INTRAMUSCULAR | Status: DC | PRN

## 2015-02-25 MED ORDER — SPIRONOLACTONE 25 MG PO TABS
25.0000 mg | ORAL_TABLET | Freq: Every day | ORAL | Status: DC
  Administered 2015-02-26 – 2015-02-28 (×3): 25 mg via ORAL
  Filled 2015-02-25 (×3): qty 1

## 2015-02-25 MED ORDER — FUROSEMIDE 10 MG/ML IJ SOLN
INTRAMUSCULAR | Status: AC
  Administered 2015-02-25: 40 mg via INTRAVENOUS
  Filled 2015-02-25: qty 4

## 2015-02-25 MED ORDER — HEPARIN BOLUS VIA INFUSION
5000.0000 [IU] | Freq: Once | INTRAVENOUS | Status: AC
Start: 2015-02-25 — End: 2015-02-25
  Filled 2015-02-25: qty 5000

## 2015-02-25 MED ORDER — ISOSORBIDE MONONITRATE ER 30 MG PO TB24
30.0000 mg | ORAL_TABLET | Freq: Every day | ORAL | Status: DC
  Administered 2015-02-26 – 2015-02-28 (×3): 30 mg via ORAL
  Filled 2015-02-25 (×3): qty 1

## 2015-02-25 MED ORDER — CEFTRIAXONE SODIUM IN DEXTROSE 20 MG/ML IV SOLN
1.0000 g | INTRAVENOUS | Status: DC
  Administered 2015-02-25 – 2015-02-26 (×2): 1 g via INTRAVENOUS
  Filled 2015-02-25 (×3): qty 50

## 2015-02-25 NOTE — H&P (Signed)
[facility] NAME: Dillon [name]    MR#:  [id number]  DATE OF BIRTH:  1946-02-18  DATE OF ADMISSION:  02/25/2015  PRIMARY CARE PHYSICIAN: Scheduled to start with the pace program in August  REQUESTING/REFERRING PHYSICIAN: Lisa Roca, M.D.   CHIEF COMPLAINT:   Leg swelling and shortness of breath  HISTORY OF PRESENT ILLNESS:  Dillon [name]  is a 69 y.o. male with a known history of recent stroke. Came in with swelling of his right lower extremity and trouble breathing since Thursday. His home health nurse came in and monitored a low oxygen on Friday. He's been having shortness of breath with exertion. Normally he walks with a walker. As per family he's been more irritated and agitated. Patient also very emotional when I was talking with him he did start to cry. He is also been having a dry cough. No hemoptysis. Family states that his urine has been brown. With the recent stroke he's had some right-sided weakness.  PAST MEDICAL HISTORY:   Past Medical History  Diagnosis Date  . Acute MI   . Coronary artery disease   . CHF (congestive heart failure)   . Hypertension   . Hyperlipidemia   . Stroke     PAST SURGICAL HISTORY:   Past Surgical History  Procedure Laterality Date  . Cardiac catheterization    . Coronary artery bypass graft    . Thumb fusion      SOCIAL HISTORY:   History  Substance Use Topics  . Smoking status: Former Smoker    Types: Cigarettes    Quit date: 01/20/1995  . Smokeless tobacco: Former Systems developer    Types: Chew  . Alcohol Use: No    FAMILY HISTORY:   Family History  Problem Relation Age of Onset  . CAD Father   . CAD Sister   . Bone cancer Mother   . Bone cancer Brother     DRUG ALLERGIES:  No Known Allergies  REVIEW OF SYSTEMS:  CONSTITUTIONAL: No fever, positive for sweats, positive for fatigue and weakness.  EYES: Right eye blind, decreased vision left eye EARS, NOSE,  AND THROAT: No tinnitus or ear pain. No sore throat. Decreased hearing. Positive for runny nose RESPIRATORY: Positive for cough and shortness of breath, no wheezing or hemoptysis.  CARDIOVASCULAR: No chest pain, positive for edema.  GASTROINTESTINAL: No nausea, vomiting, diarrhea or abdominal pain. No blood in bowel movements. GENITOURINARY: No dysuria, positive for dark urine ENDOCRINE: No polyuria, nocturia,  HEMATOLOGY: No anemia, easy bruising or bleeding SKIN: No rash or lesion. MUSCULOSKELETAL: No joint pain or arthritis.   NEUROLOGIC: No tingling, numbness, right-sided weakness.  PSYCHIATRY: No anxiety or depression.   MEDICATIONS AT HOME:   Prior to Admission medications   Medication Sig Start Date End Date Taking? Authorizing Provider  aspirin EC 81 MG EC tablet Take 1 tablet (81 mg total) by mouth daily. 01/23/15  Yes Gladstone Lighter, MD  atorvastatin (LIPITOR) 40 MG tablet Take 2 tablets (80 mg total) by mouth daily at 6 PM. Patient taking differently: Take 40 mg by mouth every evening.  02/13/15  Yes Theodoro Grist, MD  clopidogrel (PLAVIX) 75 MG tablet Take 1 tablet (75 mg total) by mouth daily. 02/13/15  Yes Theodoro Grist, MD  furosemide (LASIX) 40 MG tablet Take 1 tablet (40 mg total) by mouth 2 (two) times daily. 01/23/15  Yes Gladstone Lighter, MD  isosorbide mononitrate (IMDUR) 30 MG 24 hr tablet  Take 1 tablet (30 mg total) by mouth daily. 01/23/15  Yes Gladstone Lighter, MD  lisinopril (PRINIVIL,ZESTRIL) 40 MG tablet Take 40 mg by mouth daily.   Yes Historical Provider, MD  metolazone (ZAROXOLYN) 2.5 MG tablet Take 2.5 mg by mouth daily.   Yes Historical Provider, MD  metoprolol tartrate (LOPRESSOR) 25 MG tablet Take 25 mg by mouth 2 (two) times daily.   Yes Historical Provider, MD  nitroGLYCERIN (NITROSTAT) 0.4 MG SL tablet Place 1 tablet (0.4 mg total) under the tongue every 5 (five) minutes as needed. For chest pain. May take up to 3 doses. 02/08/15  Yes Alisa Graff, FNP   pantoprazole (PROTONIX) 40 MG tablet Take 40 mg by mouth daily.   Yes Historical Provider, MD  Potassium Chloride ER 20 MEQ TBCR Take 20 mEq by mouth daily. 01/23/15  Yes Gladstone Lighter, MD  spironolactone (ALDACTONE) 25 MG tablet Take 1 tablet (25 mg total) by mouth daily. 01/23/15  Yes Gladstone Lighter, MD  torsemide (DEMADEX) 20 MG tablet Take 20 mg by mouth 2 (two) times daily.   Yes Historical Provider, MD      VITAL SIGNS:  Blood pressure 131/85, pulse 46, temperature 98.1 F (36.7 C), temperature source Oral, resp. rate 25, height 5\' 9"  (1.753 m), weight 92.987 kg (205 lb), SpO2 99 %.  PHYSICAL EXAMINATION:  GENERAL:  69 y.o.-year-old patient lying in the bed with tachypnea  EYES: Pupils equal, round, reactive to light and accommodation. No scleral icterus. Extraocular muscles intact.  HEENT: Head atraumatic, normocephalic. Oropharynx and nasopharynx clear.  NECK:  Supple, no jugular venous distention. No thyroid enlargement, no tenderness.  LUNGS: Tachypnea with shallow breathing, slight use of accessory muscles to breathe, decreased breath sounds bilaterally, no wheezing, rales,rhonchi or crepitation. CARDIOVASCULAR: S1, S2 bradycardia. 2/6 systolic murmur, no rubs, or gallops.  ABDOMEN: Soft, nontender, nondistended. Bowel sounds present. No organomegaly or mass.  EXTREMITIES: Right lower extremity edema and cool to touch and unable to palpate pulses of dorsalis pedis or posterior tibial. I was able to palpate popliteal pulses, no cyanosis, or clubbing.  NEUROLOGIC: Cranial nerves II through XII are intact. Muscle strength 5/5 in all extremities. Sensation intact. PSYCHIATRIC: The patient is alert and oriented x 3.  SKIN: No rash, lesion, or ulcer.   LABORATORY PANEL:   CBC  Recent Labs Lab 02/25/15 0808  WBC 7.5  HGB 14.5  HCT 44.2  PLT 120*   Chemistries   Recent Labs Lab 02/25/15 0808  NA 142  K 4.5  CL 110  CO2 26  GLUCOSE 153*  BUN 25*  CREATININE  1.01  CALCIUM 9.6    Cardiac Enzymes  Recent Labs Lab 02/25/15 0808  TROPONINI 0.04*    RADIOLOGY:  Ct Angio Chest Pe W/cm &/or Wo Cm  02/25/2015   CLINICAL DATA:  Swelling of the legs and shortness of breath. Low oxygen saturation.  EXAM: CT ANGIOGRAPHY CHEST WITH CONTRAST  TECHNIQUE: Multidetector CT imaging of the chest was performed using the standard protocol during bolus administration of intravenous contrast. Multiplanar CT image reconstructions and MIPs were obtained to evaluate the vascular anatomy.  CONTRAST:  134mL OMNIPAQUE IOHEXOL 350 MG/ML SOLN  COMPARISON:  Radiography same day  FINDINGS: Pulmonary arterial opacification is excellent. Study is positive for bilateral pulmonary emboli. RV to LV ratio measures 1.15, consistent with right heart strain.  There is a left pleural effusion layering dependently. There is been previous median sternotomy. There is extensive coronary artery calcification and coronary stents.  No hilar or mediastinal mass or lymphadenopathy. No primary pulmonary lesion. Scans in the upper abdomen show an apparent mass lesion of the left kidney, not completely imaged, but quite likely to represent renal cell cancer. There is a small amount of ascites.  Review of the MIP images confirms the above findings.  IMPRESSION: Positive for acute PE with CTevidence of right heart strain (RV/LV Ratio = 1.15) consistent with at least submassive (intermediate risk) PE. The presence of right heart strain has been associated with an increased risk of morbidity and mortality.  Probable large left renal mass, not completely evaluated.  These results were called by telephone at the time of interpretation on 02/25/2015 at 11:13 am to Dr. Lisa Roca , who verbally acknowledged these results.   Electronically Signed   By: Nelson Chimes M.D.   On: 02/25/2015 11:16   US Venous Img Lower Bilateral  02/25/2015   CLINICAL DATA:  Swelling, right greater than left, x1 year. Pain. On aspirin.   EXAM: BILATERAL LOWER EXTREMITY VENOUS DOPPLER ULTRASOUND  TECHNIQUE: Gray-scale sonography with compression, as well as color and duplex ultrasound, were performed to evaluate the deep venous system from the level of the common femoral vein through the popliteal and proximal calf veins.  COMPARISON:  01/21/2015  FINDINGS: Normal compressibility of the common femoral, superficial femoral, and popliteal veins, as well as the proximal calf veins. No filling defects to suggest DVT on grayscale or color Doppler imaging. Doppler waveforms show normal direction of venous flow, normal respiratory phasicity and response to augmentation. Visualized segments of the saphenous venous systems normal in caliber and compressibility.  IMPRESSION: 1. No evidence of lower extremity deep vein thrombosis, BILATERALLY.   Electronically Signed   By: Lucrezia Europe M.D.   On: 02/25/2015 11:30   Dg Chest Port 1 View  02/25/2015   CLINICAL DATA:  Right leg swelling  EXAM: PORTABLE CHEST - 1 VIEW  COMPARISON:  02/11/2015  FINDINGS: Cardiomegaly again noted. Status post CABG. No acute infiltrate or pulmonary edema. Bony thorax is stable.  IMPRESSION: Cardiomegaly.  Status post CABG.  No active disease.   Electronically Signed   By: Lahoma Crocker M.D.   On: 02/25/2015 08:17    EKG:   Sinus bradycardia PVCs right bundle branch block, Q waves septally and laterally and flattened T waves laterally.  IMPRESSION AND PLAN:   1. Submassive pulmonary embolism. Risk is recent hospitalization and also renal mass. Since the patient did have a recent stroke, not a candidate for TPA. I did speak with Dr. Raul Del pulmonology to see the patient. IV heparin for right now. Monitor on telemetry. 2. Elevated troponin likely from demand ischemia with pulmonary embolism. 3. Recent stroke with right-sided weakness- continue aspirin. Hold Plavix since the patient is on IV heparin. 4. Decreased pulses right lower extremity and cool extremity- I will speak  with vascular surgery to evaluate the patient. 5. Renal mass- Will get urology consultation. Order urine cytology. 6.  hyperlipidemia continue atorvastatin 7. Edema lower extremities continue Lasix.   All the records are reviewed and case discussed with ED provider. Management plans discussed with the patient, family and they are in agreement.  CODE STATUS: DO NOT RESUSCITATE  TOTAL TIME TAKING CARE OF THIS PATIENT: 60 minutes.    Loletha Grayer M.D on 02/25/2015 at 12:43 PM  Between 7am to 6pm - Pager - [phone]  After 6pm call admission pager [facility]  Office  [phone]  CC: Primary care  physician; No PCP Per Patient

## 2015-02-25 NOTE — Consult Note (Signed)
[facility] Vascular Consult Note  MRN : [id number]  Dillon [name] is a 69 y.o. ([birth year]) male who presents with chief complaint of  Chief Complaint  Patient presents with  . Leg Swelling  .  History of Present Illness: Patient presents with multiple complaints. He was admitted with some chest pain and shortness of breath and found to have pulmonary embolus. He also has a history of congestive heart failure and cardiomyopathy. He was started on anticoagulation for this. In addition, he complains of pain and swelling predominantly in the right leg. He has some swelling and some discomfort in the left leg as well but not as severe as the right. He reports this having been going on for about a year and a half although it has been worse since his stroke 2-3 weeks ago. This is associated with swelling that is worse throughout the day. His legs even have swelling with elevation. There was no clear precipitating event or inciting event that started this. In the started well over a year ago see he does not remember anything dramatic. Nothing really has helped this much. Being started on anticoagulation hasn't helped much. His speech is a little delayed, but it sounds like he was walking with a cane prior to his stroke 2-3 weeks ago. He does not have ulceration or infection. The pain is mostly in his calf and knee area and not in the balls of his feet. He has no tissue loss in the lower extremities. Speaking and his stroke, he was admitted to the hospital 2-3 weeks ago with complaints of right-sided weakness and speech difficulty. This has improved somewhat since this admission, but these problems persist. This seemed to exacerbate his right leg symptoms. He had what appeared to be a moderate to high-grade carotid artery stenosis reported at that time, but no vascular consult was obtained as best I can tell. I do not know if he was scheduled to follow up in the clinic for this. He  has had no new neurologic symptoms since that admission. He denies any temporary monocular blindness, difficulty swallowing, and his speech has been gradually improving.  Current Facility-Administered Medications  Medication Dose Route Frequency Provider Last Rate Last Dose  . acetaminophen (TYLENOL) tablet 650 mg  650 mg Oral Q6H PRN Loletha Grayer, MD       Or  . acetaminophen (TYLENOL) suppository 650 mg  650 mg Rectal Q6H PRN Loletha Grayer, MD      . Derrill Memo ON 02/26/2015] aspirin EC tablet 81 mg  81 mg Oral Daily Loletha Grayer, MD      . atorvastatin (LIPITOR) tablet 40 mg  40 mg Oral QPM Loletha Grayer, MD      . cefTRIAXone (ROCEPHIN) 1 g in dextrose 5 % 50 mL IVPB - Premix  1 g Intravenous Q24H Loletha Grayer, MD      . furosemide (LASIX) tablet 40 mg  40 mg Oral BID Loletha Grayer, MD      . gadobenate dimeglumine (MULTIHANCE) injection 20 mL  20 mL Intravenous Once PRN Medication Radiologist, MD      . heparin ADULT infusion 100 units/mL (25000 units/250 mL)  1,500 Units/hr Intravenous Continuous Lisa Roca, MD 15 mL/hr at 02/25/15 1157 1,500 Units/hr at 02/25/15 1157  . [START ON 02/26/2015] isosorbide mononitrate (IMDUR) 24 hr tablet 30 mg  30 mg Oral Daily Loletha Grayer, MD      . Derrill Memo ON 02/26/2015] lisinopril (PRINIVIL,ZESTRIL) tablet 40 mg  40 mg  Oral Daily Loletha Grayer, MD      . metoprolol tartrate (LOPRESSOR) tablet 12.5 mg  12.5 mg Oral BID Loletha Grayer, MD      . morphine 2 MG/ML injection 2 mg  2 mg Intravenous Q4H PRN Loletha Grayer, MD      . nitroGLYCERIN (NITROSTAT) SL tablet 0.4 mg  0.4 mg Sublingual Q5 min PRN Loletha Grayer, MD      . oxyCODONE (Oxy IR/ROXICODONE) immediate release tablet 5 mg  5 mg Oral Q4H PRN Loletha Grayer, MD      . Derrill Memo ON 02/26/2015] pantoprazole (PROTONIX) EC tablet 40 mg  40 mg Oral Daily Loletha Grayer, MD      . Derrill Memo ON 02/26/2015] pneumococcal 23 valent vaccine (PNU-IMMUNE) injection 0.5 mL  0.5 mL Intramuscular  Tomorrow-1000 Loletha Grayer, MD      . Derrill Memo ON 02/26/2015] Potassium Chloride ER TBCR 20 mEq  20 mEq Oral Daily Loletha Grayer, MD      . sodium chloride 0.9 % injection 3 mL  3 mL Intravenous Q12H Loletha Grayer, MD   3 mL at 02/25/15 1245  . [START ON 02/26/2015] spironolactone (ALDACTONE) tablet 25 mg  25 mg Oral Daily Loletha Grayer, MD        Past Medical History  Diagnosis Date  . Acute MI   . Coronary artery disease   . CHF (congestive heart failure)   . Hypertension   . Hyperlipidemia   . Stroke     Past Surgical History  Procedure Laterality Date  . Cardiac catheterization    . Coronary artery bypass graft    . Thumb fusion      Social History History  Substance Use Topics  . Smoking status: Former Smoker    Types: Cigarettes    Quit date: 01/20/1995  . Smokeless tobacco: Former Systems developer    Types: Chew  . Alcohol Use: No  lives at home.    Family History Family History  Problem Relation Age of Onset  . CAD Father   . CAD Sister   . Bone cancer Mother   . Bone cancer Brother   no history of bleeding of clotting disorders to his knowledge  No Known Allergies   REVIEW OF SYSTEMS (Negative unless checked)  Constitutional: [] Weight loss  [] Fever  [] Chills Cardiac: [x] Chest pain   [] Chest pressure   [] Palpitations   [] Shortness of breath when laying flat   [x] Shortness of breath at rest   [] Shortness of breath with exertion. Vascular:  [x] Pain in legs with walking   [] Pain in legs at rest   [] Pain in legs when laying flat   [] Claudication   [] Pain in feet when walking  [] Pain in feet at rest  [] Pain in feet when laying flat   [] History of DVT   [] Phlebitis   [x] Swelling in legs   [] Varicose veins   [] Non-healing ulcers Pulmonary:   [] Uses home oxygen   [] Productive cough   [] Hemoptysis   [] Wheeze  [] COPD   [] Asthma Neurologic:  [x] Dizziness  [] Blackouts   [] Seizures   [x] History of stroke   [] History of TIA  [] Aphasia   [] Temporary blindness   [] Dysphagia    [x] Weakness or numbness in arms   [x] Weakness or numbness in legs Musculoskeletal:  [] Arthritis   [x] Joint swelling   [] Joint pain   [] Low back pain Hematologic:  [] Easy bruising  [] Easy bleeding   [] Hypercoagulable state   [] Anemic  [] Hepatitis Gastrointestinal:  [] Blood in stool   [] Vomiting blood  [] Gastroesophageal reflux/heartburn   []   Difficulty swallowing. Genitourinary:  [] Chronic kidney disease   [] Difficult urination  [] Frequent urination  [] Burning with urination   [] Blood in urine Skin:  [] Rashes   [] Ulcers   [] Wounds Psychological:  [] History of anxiety   []  History of major depression.  Physical Examination  Filed Vitals:   02/25/15 1030 02/25/15 1130 02/25/15 1200 02/25/15 1306  BP: 126/89 144/75 131/85 138/81  Pulse: 36 55 46 50  Temp:    97.8 F (36.6 C)  TempSrc:    Oral  Resp: 13 26 25 24   Height:    5\' 9"  (1.753 m)  Weight:    214 lb 6.4 oz (97.251 kg)  SpO2: 93% 98% 99% 96%   Body mass index is 31.65 kg/(m^2).  Head: [place]/AT, No temporalis wasting. Prominent temp pulse not noted. Ear/Nose/Throat: Hearing grossly intact, nares w/o erythema or drainage, oropharynx w/o Erythema/Exudate  Dentition poor Eyes: PERRLA, EOMI.  Neck: Supple, no nuchal rigidity.  No bruit or JVD.  Pulmonary:  Good air movement, clear to auscultation bilaterally and equal.  Cardiac: irregular, murmur present ,No rubs or gallops. Vascular:  Vessel Right Left  Radial Palpable Palpable  Ulnar Palpable Palpable  Brachial Palpable Palpable  Carotid Palpable, without bruit Palpable, with bruit  Aorta Not palpable N/A  Femoral Palpable Palpable  Popliteal Not Palpable Not Palpable  PT Not Palpable Not Palpable  DP Not Palpable Not Palpable   Gastrointestinal: soft, non-tender/non-distended. No guarding/reflex. No masses, surgical incisions, or scars. Musculoskeletal: right arm and leg weak.  Cap refill sluggish but present in both legs.  No deformity or atrophy. Right leg with 2-3+ edema.   Left leg with 1+ edema Neurologic: CN 2-12 intact. Pain and light touch intact in extremities.  Right arm and leg have 3-4/5 strength after his stroke.  Speech is labored as well. Psychiatric: Judgment intact, Mood & affect appropriate for pt's clinical situation. Dermatologic: No rashes or ulcers noted.  No cellulitis or open wounds. Lymph : No Cervical, Axillary, or Inguinal lymphadenopathy.    CBC Lab Results  Component Value Date   WBC 7.5 02/25/2015   HGB 14.5 02/25/2015   HCT 44.2 02/25/2015   MCV 88.1 02/25/2015   PLT 120* 02/25/2015    BMET    Component Value Date/Time   NA 142 02/25/2015 0808   NA 141 04/12/2014 0452   K 4.5 02/25/2015 0808   K 3.9 04/12/2014 0452   CL 110 02/25/2015 0808   CL 102 04/12/2014 0452   CO2 26 02/25/2015 0808   CO2 29 04/12/2014 0452   GLUCOSE 153* 02/25/2015 0808   GLUCOSE 124* 04/12/2014 0452   BUN 25* 02/25/2015 0808   BUN 21* 04/12/2014 0452   CREATININE 1.01 02/25/2015 0808   CREATININE 1.16 04/12/2014 0452   CALCIUM 9.6 02/25/2015 0808   CALCIUM 8.6 04/12/2014 0452   GFRNONAA >60 02/25/2015 0808   GFRNONAA >60 04/12/2014 0452   GFRAA >60 02/25/2015 0808   GFRAA >60 04/12/2014 0452   Estimated Creatinine Clearance: 80.5 mL/min (by C-G formula based on Cr of 1.01).  COAG Lab Results  Component Value Date   INR 1.22 02/25/2015   INR 1.3 04/12/2014   INR 1.1 01/04/2014    Radiology CTA from 2 weeks ago suggests a 65% left carotid stenosis. I have independently reviewed this study and this is a clear underestimate by the radiologist. In addition, this is a very irregular and ulcerative lesion and even at 65% would pose a high risk of stroke.  I  would estimate the degree of stenosis to be more in the 80-85% range. This is clearly a symptomatic lesion given his left-sided stroke 2 weeks ago.    Assessment/Plan 1. Right lower extremity pain and swelling. Concern is present for ischemia by the primary service. Capillary  refill is present. His motor exam is as intact as it would be after a recent stroke. He does not have palpable pulses in either lower extremity and I suspect he has significant chronic peripheral vascular disease. This does not appear to be an acute issue. We have discussed that at our hospital we do not have noninvasive duplex were ABIs available. We can consider an angiogram at some point during this hospitalization. I would have offered this to him today, but given the fact he was fed lunch this is not a safe option. We can follow him in the hospital and consider this another day. I suspect his pain and swelling are as much related to immobility and his recent stroke on that side as they are arterial insufficiency. 2. Recent left sided stroke with a significant left carotid artery stenosis.   CTA from 2 weeks ago suggests a 65% left carotid stenosis. I have independently reviewed this study and this is a clear underestimate by the radiologist. In addition, this is a very irregular and ulcerative lesion and even at 65% would pose a high risk of stroke.  I would estimate the degree of stenosis to be more in the 80-85% range. This is clearly a symptomatic lesion given his left-sided stroke 2 weeks ago. We have not seen the patient for this and I am not sure if he was scheduled to follow up with [place] in the office or another vascular surgeon. This should clearly be considered for intervention for stroke risk reduction in the future. Would consider either full anticoagulation for his cardiomyopathy/carotid stenosis/pulmonary embolus and continuing this as an outpatient. This could be repaired with a carotid stent from an anatomic standpoint if he is felt to be a high risk candidate for open surgery. 3.  Pulmonary embolus. Being treated with full anticoagulation which I would agree with. Do not see a role for IVC filter at current. 4. Cardiomyopathy. Anticoagulation likely helpful for this as well. It is possible he  had a systemic embolus to his leg quite some time ago, but given the chronicity of his symptoms and his likely peripheral arterial disease this may not be the case. 5.  Hypertension.  Stable.  Management per primary service 6.  Hyperlipidemia. Stable.  Management per primary service  Will follow and consider treatment for both his lower extremity peripheral arterial disease and carotid disease in the future. Very complex and difficult situation overall. His current pulmonary embolus and need for anticoagulation also complicate the situation.    DEW,JASON, MD  02/25/2015 4:57 PM

## 2015-02-25 NOTE — Progress Notes (Signed)
Date: 02/25/2015,   MRN# [id number] Dillon [name] [birth year] Code Status:     Code Status Orders        Start     Ordered   02/25/15 1230  Do not attempt resuscitation (DNR)   Continuous    Question Answer Comment  In the event of cardiac or respiratory ARREST Do not call a "code blue"   In the event of cardiac or respiratory ARREST Do not perform Intubation, CPR, defibrillation or ACLS   In the event of cardiac or respiratory ARREST Use medication by any route, position, wound care, and other measures to relive pain and suffering. May use oxygen, suction and manual treatment of airway obstruction as needed for comfort.   Comments Nurse may pronounce      02/25/15 1230     Hosp day:@LENGTHOFSTAYDAYS @ Referring MD: @ATDPROV @         AdmissionWeight: 205 lb (92.987 kg)                 CurrentWeight: 214 lb 6.4 oz (97.251 kg) Dillon [name] is a 69 y.o. old male seen in consultation  at the request of Dr. Earleen Newport.   CC: Pulmonary embolism  HPI: This is a 69 year old male came in today to the ER complaining of shortness of breath and right leg pain. Sob since last Thursday. Low sats. Recent stroke with mild right sided weakness. (MRI of the brain showed stroke in 2 areas , left pontine infarct as well as left temporal occipital lobe). On workup noted to have bilateral pulmonary emboli, pleural effusion, ? Left  renal mass.  On venous doppler, no DVT noted. Heparin started, his b/p is in normal range, on no pressors. Sats holding as well.  PMHX:   Past Medical History  Diagnosis Date  . Acute MI   . Coronary artery disease   . CHF (congestive heart failure)   . Hypertension   . Hyperlipidemia   . Stroke    Surgical Hx:  Past Surgical History  Procedure Laterality Date  . Cardiac catheterization    . Coronary artery bypass graft    . Thumb fusion     Family Hx:  Family History  Problem Relation Age of Onset  . CAD Father   . CAD Sister   . Bone cancer Mother   .  Bone cancer Brother    Social Hx:   History  Substance Use Topics  . Smoking status: Former Smoker    Types: Cigarettes    Quit date: 01/20/1995  . Smokeless tobacco: Former Systems developer    Types: Chew  . Alcohol Use: No   Medication:    Home Medication:  No current outpatient prescriptions on file.  Current Medication: @CURMEDTAB @   Allergies:  Review of patient's allergies indicates no known allergies.  Review of Systems: Gen:  Denies  fever, sweats, chills HEENT: Denies blurred vision, double vision, ear pain, eye pain, hearing loss, nose bleeds, sore throat Cvc:  No dizziness, chest pain or heaviness Resp:  No pleurisy , no hemoptysis  Gi: Denies swallowing difficulty, stomach pain, nausea or vomiting, diarrhea, constipation, bowel incontinence Gu:  Denies bladder incontinence, burning urine Ext:   No Joint pain, stiffness or swelling Skin: No skin rash, easy bruising or bleeding or hives Endoc:  No polyuria, polydipsia , polyphagia or weight change Psych: No depression, insomnia or hallucinations  Other:  All other systems negative  Physical Examination:   VS: BP 138/81 mmHg  Pulse 50  Temp(Src) 97.8 F (36.6 C) (Oral)  Resp 24  Ht 5\' 9"  (1.753 m)  Wt 214 lb 6.4 oz (97.251 kg)  BMI 31.65 kg/m2  SpO2 96%  General Appearance: No distress  Neuro/physch , mental status, speech slurred, alert  cranial nerves 2-12 intact, reflexes normal and symmetric, sensation grossly normal, mild decrease right side strength  HEENT: PERRLA, EOM intact, no ptosis, no other lesions noticed Neck: Supple, no stridor, nodes, neck slightly up Pulmonary:.No wheezing, No rales  Sputum Production:   Cardiovascular:  Normal S1,S2.  No m/r/g.  Abdominal aorta pulsation normal.    Abdomen:Benign, Soft, non-tender, No masses, hepatosplenomegaly, No lymphadenopathy Endoc: No evident thyromegaly, no signs of acromegaly or Cushing features Skin:   warm, no rashes, no ecchymosis  Extremities: normal,  no cyanosis, clubbing, right lower leg cool extremity  Labs results:   Recent Labs     02/25/15  0808  HGB  14.5  HCT  44.2  MCV  88.1  WBC  7.5  BUN  25*  CREATININE  1.01  GLUCOSE  153*  CALCIUM  9.6  INR  1.22  , 6/16, last admission 1. echocardiogram showed ejection fraction of 20-25%, and uses of anteroseptal myocardium as well as lateral myocardium and inferior myocardium as well as apical myocardium. Moderate mitral valve regurgitation and moderate tricuspid regurgitation    Ct Angio Chest Pe W/cm &/or Wo Cm  02/25/2015   CLINICAL DATA:  Swelling of the legs and shortness of breath. Low oxygen saturation.  EXAM: CT ANGIOGRAPHY CHEST WITH CONTRAST  TECHNIQUE: Multidetector CT imaging of the chest was performed using the standard protocol during bolus administration of intravenous contrast. Multiplanar CT image reconstructions and MIPs were obtained to evaluate the vascular anatomy.  CONTRAST:  139mL OMNIPAQUE IOHEXOL 350 MG/ML SOLN  COMPARISON:  Radiography same day  FINDINGS: Pulmonary arterial opacification is excellent. Study is positive for bilateral pulmonary emboli. RV to LV ratio measures 1.15, consistent with right heart strain.  There is a left pleural effusion layering dependently. There is been previous median sternotomy. There is extensive coronary artery calcification and coronary stents.  No hilar or mediastinal mass or lymphadenopathy. No primary pulmonary lesion. Scans in the upper abdomen show an apparent mass lesion of the left kidney, not completely imaged, but quite likely to represent renal cell cancer. There is a small amount of ascites.  Review of the MIP images confirms the above findings.  IMPRESSION: Positive for acute PE with CTevidence of right heart strain (RV/LV Ratio = 1.15) consistent with at least submassive (intermediate risk) PE. The presence of right heart strain has been associated with an increased risk of morbidity and mortality.  Probable large left  renal mass, not completely evaluated.  These results were called by telephone at the time of interpretation on 02/25/2015 at 11:13 am to Dr. Lisa Roca , who verbally acknowledged these results.   Electronically Signed   By: Nelson Chimes M.D.   On: 02/25/2015 11:16   US Venous Img Lower Bilateral  02/25/2015   CLINICAL DATA:  Swelling, right greater than left, x1 year. Pain. On aspirin.  EXAM: BILATERAL LOWER EXTREMITY VENOUS DOPPLER ULTRASOUND  TECHNIQUE: Gray-scale sonography with compression, as well as color and duplex ultrasound, were performed to evaluate the deep venous system from the level of the common femoral vein through the popliteal and proximal calf veins.  COMPARISON:  01/21/2015  FINDINGS: Normal compressibility of the common femoral, superficial femoral, and popliteal veins, as well as the  proximal calf veins. No filling defects to suggest DVT on grayscale or color Doppler imaging. Doppler waveforms show normal direction of venous flow, normal respiratory phasicity and response to augmentation. Visualized segments of the saphenous venous systems normal in caliber and compressibility.  IMPRESSION: 1. No evidence of lower extremity deep vein thrombosis, BILATERALLY.   Electronically Signed   By: Lucrezia Europe M.D.   On: 02/25/2015 11:30   Dg Chest Port 1 View  02/25/2015   CLINICAL DATA:  Right leg swelling  EXAM: PORTABLE CHEST - 1 VIEW  COMPARISON:  02/11/2015  FINDINGS: Cardiomegaly again noted. Status post CABG. No acute infiltrate or pulmonary edema. Bony thorax is stable.  IMPRESSION: Cardiomegaly.  Status post CABG.  No active disease.   Electronically Signed   By: Lahoma Crocker M.D.   On: 02/25/2015 08:17      Assessment and Plan:  This is a 4 year ols male with a recent stroke ( 2 weeks ago), came back in with bilateral pulmonary embolism, no dvt. His b/p and oxygenation is maintained. Beside that he had a recent stroke. Hence thrombolytic is a realitive contraindication.  -continue  heparin, start coumidin 02/26/15 -no IVC filter placement -following vitals and sats closely  ? Left renal mass, beside his difficulty walking, this may be also another reason for hypercoagulable state -urology consult  Cardiomyopathy, EF 20-25 %, surprisingly he is handling this heavy burden of clot -watch salt and fluid -diuresis, after loading  Decrease circulation to right lower extremity   -vascular to see -? Arterial doppler  I have personally obtained a history, examined the patient, evaluated laboratory and imaging results, formulated the assessment and plan and placed orders.  The Patient requires high complexity decision making for assessment and support, frequent evaluation and titration of therapies, application of advanced monitoring technologies and extensive interpretation of multiple databases.   Adrion Menz,M.D. Pulmonary & Critical care Medicine [facility]

## 2015-02-25 NOTE — Progress Notes (Addendum)
Notify MD via telephone that troponin was positive and urinalysis was positive with leukocytes, MD acknowledged and inquired about vital signs which remain stable at this time. Spoke with Dr. Leslye Peer.

## 2015-02-25 NOTE — ED Notes (Signed)
Patient transported to CT 

## 2015-02-25 NOTE — Consult Note (Addendum)
Urology Consult  I have been asked to see the patient by Dr. Earleen Newport, for evaluation and management of left renal mass.  Chief Complaint: shortness of breath  History of Present Illness: Dillon [name] is a 69 y.o. year old admitted to the hospitalist service with shortness of breath, hypoxia, and left lower extremity swelling. Of note, he also had a recent stroke with right sided weakness.  Workup in the ER revealed CTPA consistent with submassive PE and evidence of right heart strain. He since been started on a heparin drip but not a candidate for TPA given his recent stroke.  He did have bilateral lower extremity Dopplers which revealed no obvious DVT.  Additionally, he was noted to have what appears to be a possible large left renal mass on CT PA although the entire kidney is not visualized.  He denies any flank pain or gross hematuria.  No difficulty voiding other than urinary frequency related to diuretic.       Past Medical History  Diagnosis Date  . Acute MI   . Coronary artery disease   . CHF (congestive heart failure)   . Hypertension   . Hyperlipidemia   . Stroke     Past Surgical History  Procedure Laterality Date  . Cardiac catheterization    . Coronary artery bypass graft    . Thumb fusion      Home Medications:    Medication List    ASK your doctor about these medications        aspirin 81 MG EC tablet  Take 1 tablet (81 mg total) by mouth daily.     atorvastatin 40 MG tablet  Commonly known as:  LIPITOR  Take 2 tablets (80 mg total) by mouth daily at 6 PM.     clopidogrel 75 MG tablet  Commonly known as:  PLAVIX  Take 1 tablet (75 mg total) by mouth daily.     furosemide 40 MG tablet  Commonly known as:  LASIX  Take 1 tablet (40 mg total) by mouth 2 (two) times daily.     isosorbide mononitrate 30 MG 24 hr tablet  Commonly known as:  IMDUR  Take 1 tablet (30 mg total) by mouth daily.     lisinopril 40 MG tablet  Commonly known as:   PRINIVIL,ZESTRIL  Take 40 mg by mouth daily.     metolazone 2.5 MG tablet  Commonly known as:  ZAROXOLYN  Take 2.5 mg by mouth daily.     metoprolol tartrate 25 MG tablet  Commonly known as:  LOPRESSOR  Take 25 mg by mouth 2 (two) times daily.     nitroGLYCERIN 0.4 MG SL tablet  Commonly known as:  NITROSTAT  Place 1 tablet (0.4 mg total) under the tongue every 5 (five) minutes as needed. For chest pain. May take up to 3 doses.     pantoprazole 40 MG tablet  Commonly known as:  PROTONIX  Take 40 mg by mouth daily.     Potassium Chloride ER 20 MEQ Tbcr  Take 20 mEq by mouth daily.     spironolactone 25 MG tablet  Commonly known as:  ALDACTONE  Take 1 tablet (25 mg total) by mouth daily.     torsemide 20 MG tablet  Commonly known as:  DEMADEX  Take 20 mg by mouth 2 (two) times daily.        Allergies: No Known Allergies  Family History  Problem Relation Age of Onset  .  CAD Father   . CAD Sister   . Bone cancer Mother   . Bone cancer Brother     Social History:  reports that he quit smoking about 20 years ago. His smoking use included Cigarettes. He has quit using smokeless tobacco. His smokeless tobacco use included Chew. He reports that he does not drink alcohol or use illicit drugs.  ROS: A complete review of systems was performed.  All systems are negative except for pertinent findings as noted.   Physical Exam:  Vital signs in last 24 hours: Temp:  [97.8 F (36.6 C)-98.1 F (36.7 C)] 97.8 F (36.6 C) (06/27 1306) Pulse Rate:  [35-60] 50 (06/27 1306) Resp:  [13-40] 24 (06/27 1306) BP: (114-144)/(56-89) 138/81 mmHg (06/27 1306) SpO2:  [91 %-99 %] 96 % (06/27 1306) Weight:  [205 lb (92.987 kg)-214 lb 6.4 oz (97.251 kg)] 214 lb (97.07 kg) (06/27 1629) Constitutional:  Alert and oriented, No acute distress.  Hard of hearing.   HEENT: [place] AT, moist mucus membranes.  Trachea midline, no masses Cardiovascular: Regular rate and rhythm.  2+ LLE edema, 3+ RLE  edema. Respiratory: Normal respiratory effort/  No increased work of breathing. GI: Abdomen is soft, nontender, nondistended, no abdominal masses GU: No CVA tenderness.  Uncircumcised phallus.  Scrotum unremarkable with no pathology.  Skin: No rashes, bruises or suspicious lesions Lymph: No cervical or inguinal adenopathy Neurologic: Grossly intact, no focal deficits, moving all 4 extremities Psychiatric: Somewhat flat affect.     Laboratory Data:   Recent Labs  02/25/15 0808  WBC 7.5  HGB 14.5  HCT 44.2    Recent Labs  02/25/15 0808  NA 142  K 4.5  CL 110  CO2 26  GLUCOSE 153*  BUN 25*  CREATININE 1.01  CALCIUM 9.6    Recent Labs  02/25/15 0808  INR 1.22   No results for input(s): LABURIN in the last 72 hours. Results for orders placed or performed in visit on 01/23/14  Culture, blood (single)     Status: None   Collection Time: 01/23/14 11:54 PM  Result Value Ref Range Status   Micro Text Report   Final       COMMENT                   NO GROWTH AEROBICALLY/ANAEROBICALLY IN 5 DAYS   ANTIBIOTIC                                                      Culture, blood (single)     Status: None   Collection Time: 01/23/19 11:49 PM  Result Value Ref Range Status   Micro Text Report   Final       COMMENT                   NO GROWTH AEROBICALLY/ANAEROBICALLY IN 5 DAYS   ANTIBIOTIC                                                       Urinalysis    Component Value Date/Time   COLORURINE YELLOW* 02/25/2015 1320   APPEARANCEUR CLEAR* 02/25/2015 1320   LABSPEC 1.013 02/25/2015  1320   PHURINE 5.0 02/25/2015 1320   GLUCOSEU NEGATIVE 02/25/2015 1320   HGBUR 2+* 02/25/2015 1320   BILIRUBINUR NEGATIVE 02/25/2015 1320   KETONESUR NEGATIVE 02/25/2015 1320   PROTEINUR NEGATIVE 02/25/2015 1320   NITRITE NEGATIVE 02/25/2015 1320   LEUKOCYTESUR 3+* 02/25/2015 1320    Radiologic Imaging: Ct Angio Chest Pe W/cm &/or Wo Cm  02/25/2015   CLINICAL DATA:  Swelling of  the legs and shortness of breath. Low oxygen saturation.  EXAM: CT ANGIOGRAPHY CHEST WITH CONTRAST  TECHNIQUE: Multidetector CT imaging of the chest was performed using the standard protocol during bolus administration of intravenous contrast. Multiplanar CT image reconstructions and MIPs were obtained to evaluate the vascular anatomy.  CONTRAST:  161mL OMNIPAQUE IOHEXOL 350 MG/ML SOLN  COMPARISON:  Radiography same day  FINDINGS: Pulmonary arterial opacification is excellent. Study is positive for bilateral pulmonary emboli. RV to LV ratio measures 1.15, consistent with right heart strain.  There is a left pleural effusion layering dependently. There is been previous median sternotomy. There is extensive coronary artery calcification and coronary stents.  No hilar or mediastinal mass or lymphadenopathy. No primary pulmonary lesion. Scans in the upper abdomen show an apparent mass lesion of the left kidney, not completely imaged, but quite likely to represent renal cell cancer. There is a small amount of ascites.  Review of the MIP images confirms the above findings.  IMPRESSION: Positive for acute PE with CTevidence of right heart strain (RV/LV Ratio = 1.15) consistent with at least submassive (intermediate risk) PE. The presence of right heart strain has been associated with an increased risk of morbidity and mortality.  Probable large left renal mass, not completely evaluated.  These results were called by telephone at the time of interpretation on 02/25/2015 at 11:13 am to Dr. Lisa Roca , who verbally acknowledged these results.   Electronically Signed   By: Nelson Chimes M.D.   On: 02/25/2015 11:16   US Venous Img Lower Bilateral  02/25/2015   CLINICAL DATA:  Swelling, right greater than left, x1 year. Pain. On aspirin.  EXAM: BILATERAL LOWER EXTREMITY VENOUS DOPPLER ULTRASOUND  TECHNIQUE: Gray-scale sonography with compression, as well as color and duplex ultrasound, were performed to evaluate the deep  venous system from the level of the common femoral vein through the popliteal and proximal calf veins.  COMPARISON:  01/21/2015  FINDINGS: Normal compressibility of the common femoral, superficial femoral, and popliteal veins, as well as the proximal calf veins. No filling defects to suggest DVT on grayscale or color Doppler imaging. Doppler waveforms show normal direction of venous flow, normal respiratory phasicity and response to augmentation. Visualized segments of the saphenous venous systems normal in caliber and compressibility.  IMPRESSION: 1. No evidence of lower extremity deep vein thrombosis, BILATERALLY.   Electronically Signed   By: Lucrezia Europe M.D.   On: 02/25/2015 11:30   Dg Chest Port 1 View  02/25/2015   CLINICAL DATA:  Right leg swelling  EXAM: PORTABLE CHEST - 1 VIEW  COMPARISON:  02/11/2015  FINDINGS: Cardiomegaly again noted. Status post CABG. No acute infiltrate or pulmonary edema. Bony thorax is stable.  IMPRESSION: Cardiomegaly.  Status post CABG.  No active disease.   Electronically Signed   By: Lahoma Crocker M.D.   On: 02/25/2015 08:17    Impression/Assessment:  69 year old male with possible large left renal mass admitted for submassive PE, hypoxia, shortness of breath, and lower extremity swelling.  The mass was seen incidentally and only partially visualized  on CT PA. I have recommended follow-up imaging in the form of MRI of the abdomen with and without contrast which will both give [place] information about the mass itself including the size and extent as well as the possibility of renal vein or IVC tumor thrombus which may the possible source of his PE.  Plan:  -recommend MRI abd with and without contrast for renal mass protocol (discussed with Dr. Posey Pronto radiology and Dr. Leslye Peer) -will follow up results and discuss with patient   02/25/2015, 4:56 PM  Hollice Espy,  MD   Thank you for involving me in this patient's care, I will continue to follow along. Please page with any  further questions or concerns.   Addendum 02/26/15 MRI results reviewed.  Large, irregular and lobular 8.6 x 6.9 x 7.2 cm enhancing centrally necrotic mass exophytic from the medial upper pole of the left kidney. The imaging appearance is most consistent with a renal cell carcinoma. No evidence of renal vein invasion, suspicious adenopathy or distant metastatic disease.  These findings were discussed with the patient this AM.  Unfortunately, given the patient's severe comorbidities including recent stroke, submassive PE, EF 20%, etc, I do not feel that he is a surgical candidate or would tolerate nephrectomy.  I would recommend outpatient urologic follow-up to discuss options for palliation should he become symptomatic from the mass.  This was also discussed with Dr. Posey Pronto, hospitalist, who agrees with this plan and is discuss it further with his daughter.

## 2015-02-25 NOTE — Progress Notes (Signed)
ANTICOAGULATION CONSULT NOTE - Initial Consult  Pharmacy Consult for Heparin  Indication: pulmonary embolus  No Known Allergies  Patient Measurements: Height: 5\' 9"  (175.3 cm) Weight: 205 lb (92.987 kg) IBW/kg (Calculated) : 70.7 Heparin Dosing Weight: 90kg  Vital Signs: Temp: 98.1 F (36.7 C) (06/27 0735) Temp Source: Oral (06/27 0735) BP: 144/75 mmHg (06/27 1130) Pulse Rate: 55 (06/27 1130)  Labs:  Recent Labs  02/25/15 0808  HGB 14.5  HCT 44.2  PLT 120*  CREATININE 1.01  TROPONINI 0.04*    Estimated Creatinine Clearance: 78.8 mL/min (by C-G formula based on Cr of 1.01).   Medical History: Past Medical History  Diagnosis Date  . Acute MI   . Coronary artery disease   . CHF (congestive heart failure)   . Hypertension   . Hyperlipidemia    Assessment: Pharmacy consulted to dose heparin for PE in this 69 year old male. Patient on apsirin and plavix at home for history of CVA, not on anticoagulation  Goal of Therapy:  Heparin level 0.3-0.7 units/ml Monitor platelets by anticoagulation protocol: Yes   Plan:  Give 5000 units bolus x 1 Start heparin infusion at 1500 units/hr Check anti-Xa level in 6 hours and daily while on heparin Continue to monitor H&H and platelets  Rexene Edison, PharmD Clinical Pharmacist  02/25/2015 11:40 AM

## 2015-02-25 NOTE — Progress Notes (Signed)
ANTICOAGULATION CONSULT NOTE - Follow Up Consult  Pharmacy Consult for Heparin Indication: pulmonary embolus  No Known Allergies  Patient Measurements: Height: 5\' 9"  (175.3 cm) Weight: 214 lb 6.4 oz (97.251 kg) IBW/kg (Calculated) : 70.7 Heparin Dosing Weight: 89 kg  Vital Signs: Temp: 97.8 F (36.6 C) (06/27 1306) Temp Source: Oral (06/27 1306) BP: 138/81 mmHg (06/27 1306) Pulse Rate: 50 (06/27 1306)  Labs:  Recent Labs  02/25/15 0808 02/25/15 1428 02/25/15 1803  HGB 14.5  --   --   HCT 44.2  --   --   PLT 120*  --   --   APTT 31  --   --   LABPROT 15.6*  --   --   INR 1.22  --   --   HEPARINUNFRC  --   --  0.16*  CREATININE 1.01  --   --   TROPONINI 0.04* 0.04*  --     Estimated Creatinine Clearance: 80.5 mL/min (by C-G formula based on Cr of 1.01).   Medications:  Scheduled:  . [START ON 02/26/2015] aspirin EC  81 mg Oral Daily  . atorvastatin  40 mg Oral QPM  . cefTRIAXone (ROCEPHIN)  IV  1 g Intravenous Q24H  . furosemide  40 mg Oral BID  . [COMPLETED] heparin  2,700 Units Intravenous Once  . [START ON 02/26/2015] isosorbide mononitrate  30 mg Oral Daily  . [START ON 02/26/2015] lisinopril  40 mg Oral Daily  . metoprolol tartrate  12.5 mg Oral BID  . [START ON 02/26/2015] pantoprazole  40 mg Oral Daily  . [START ON 02/26/2015] pneumococcal 23 valent vaccine  0.5 mL Intramuscular Tomorrow-1000  . [START ON 02/26/2015] Potassium Chloride ER  20 mEq Oral Daily  . sodium chloride  3 mL Intravenous Q12H  . [START ON 02/26/2015] spironolactone  25 mg Oral Daily  . [START ON 02/26/2015] warfarin  7.5 mg Oral q morning - 10a  . Warfarin - Physician Dosing Inpatient   Does not apply q1800   Infusions:  . heparin 1,800 Units/hr (02/25/15 1838)   PRN: acetaminophen **OR** acetaminophen, morphine injection, nitroGLYCERIN, oxyCODONE  Assessment: Patient on heparin drip for PE with HL below goal.   Goal of Therapy:  Heparin level 0.3-0.7 units/ml Monitor platelets  by anticoagulation protocol: Yes   Plan:  Will bolus heparin 2700 units iv and increase infusion to 1800 units/hr. Next HL in 6 h.   Ulice Dash D 02/25/2015,6:38 PM

## 2015-02-25 NOTE — ED Notes (Signed)
Heparin verified by Minette Brine RN

## 2015-02-25 NOTE — ED Notes (Signed)
Patient transported to Ultrasound 

## 2015-02-25 NOTE — ED Notes (Signed)
Patient returned to room and cardiac monitor in place.

## 2015-02-25 NOTE — ED Notes (Signed)
Patient returned from CT and cardiac monitor reapplied.

## 2015-02-25 NOTE — Care Management (Signed)
Patient presents from home.  Had been complaining of shortness of breath and increased pain in his right leg. Has not been participating with the home health physical therapy and mental status is altered on  And off.  Daughter now says that she works during the day and there is concern now whether patient can manage by himself while his daughter is at work.  He is diagnosed with pulmonary embolus.  May need to consider placement.  Notified [facility] of readmission.  Patient was receiving SN PT OT Aide SLP and SW.

## 2015-02-25 NOTE — Progress Notes (Signed)
ANTICOAGULATION CONSULT NOTE - Initial Consult  Pharmacy Consult for Heparin, warfarin  Indication: pulmonary embolus  No Known Allergies  Patient Measurements: Height: 5\' 9"  (175.3 cm) Weight: 214 lb 6.4 oz (97.251 kg) IBW/kg (Calculated) : 70.7 Heparin Dosing Weight: 90kg  Vital Signs: Temp: 97.8 F (36.6 C) (06/27 1306) Temp Source: Oral (06/27 1306) BP: 138/81 mmHg (06/27 1306) Pulse Rate: 50 (06/27 1306)  Labs:  Recent Labs  02/25/15 0808 02/25/15 1428  HGB 14.5  --   HCT 44.2  --   PLT 120*  --   APTT 31  --   LABPROT 15.6*  --   INR 1.22  --   CREATININE 1.01  --   TROPONINI 0.04* 0.04*    Estimated Creatinine Clearance: 80.5 mL/min (by C-G formula based on Cr of 1.01).   Medical History: Past Medical History  Diagnosis Date  . Acute MI   . Coronary artery disease   . CHF (congestive heart failure)   . Hypertension   . Hyperlipidemia   . Stroke    Assessment: Pharmacy consulted to dose heparin for PE in this 69 year old male. Patient on apsirin and plavix at home for history of CVA, not on anticoagulation  6/27 : INR = 1.22   Goal of Therapy:  Heparin level 0.3-0.7 units/ml Monitor platelets by anticoagulation protocol: Yes  INR = 2 - 3 .    Plan:  Give 5000 units bolus x 1 Start heparin infusion at 1500 units/hr Check anti-Xa level in 6 hours and daily while on heparin Continue to monitor H&H and platelets   Warfarin 7.5 gm PO daily ordered to start 6/27. Will check INR's daily until therapeutic.   Rexene Edison, PharmD Clinical Pharmacist  02/25/2015 5:55 PM

## 2015-02-25 NOTE — ED Notes (Signed)
Pts family reports that his legs are swollen and that he has been working to breathe. She reports that the home health nurse came out this weekend and his O2 was low.

## 2015-02-25 NOTE — ED Provider Notes (Signed)
[facility] Emergency Department Provider Note   ____________________________________________  Time seen: On arrival to room I have reviewed the triage vital signs and the triage nursing note.  HISTORY  Chief Complaint Leg Swelling   Historian Daughter Patient is a poor historian   HPI Dillon [name] is a 69 y.o. male who has recently been living with his daughter since a recent diagnosis of "2 strokes." She has noted that over the past 1 week he has been increasingly short of breath especially when walking very short distances. He's also had increased lower extremity edema which is much worse on the right than the left. He is complaining of pain in his right leg. He was recently started on Plavix as an addition to a baby aspirin after the strokes. He does have a history of congestive heart failure and follows with Dr. Nehemiah Massed for this. Daughter states that the vision seems to have been confused ever since the last strokes. He has tried going down the stairs with his walker. He has been found turning on the stove to "cook spaghetti. "However when the daughter asked him about cooking, he did not know why he was cooking. He has told the home health nurses and possibly physical therapist over the phone that he does not need any more help. Although this is completely irrational given the fact that he can barely walk on his own, and he has not participated with any physical therapy up to this point due to leg pain and shortness of breath. The daughter is concerned that he is unsafe to be home alone. And she cannot stay with him 24 7 due to working herself. She believes he's had a cognitive decline since the recent diagnosis of stroke and is unsafe to be living at home.   Past Medical History  Diagnosis Date  . Acute MI   . Coronary artery disease   . CHF (congestive heart failure)   . Hypertension   . Hyperlipidemia     Patient Active Problem List   Diagnosis  Date Noted  . Pressure ulcer 02/13/2015  . Carotid artery stenosis 02/13/2015  . Occlusion and stenosis of vertebral artery 02/13/2015  . Cardiomyopathy, ischemic 02/13/2015  . CVA (cerebral infarction) 02/11/2015  . Right hemiparesis 02/11/2015  . HTN (hypertension) 02/11/2015  . CAD (coronary artery disease) 02/11/2015  . Chronic systolic heart failure 38/05/1750  . Hyperlipidemia     Past Surgical History  Procedure Laterality Date  . Cardiac catheterization    . Coronary artery bypass graft    . Thumb fusion      Current Outpatient Rx  Name  Route  Sig  Dispense  Refill  . aspirin EC 81 MG EC tablet   Oral   Take 1 tablet (81 mg total) by mouth daily.   30 tablet   1   . atorvastatin (LIPITOR) 40 MG tablet   Oral   Take 2 tablets (80 mg total) by mouth daily at 6 PM. Patient taking differently: Take 40 mg by mouth every evening.    30 tablet   1   . clopidogrel (PLAVIX) 75 MG tablet   Oral   Take 1 tablet (75 mg total) by mouth daily.   30 tablet   3   . furosemide (LASIX) 40 MG tablet   Oral   Take 1 tablet (40 mg total) by mouth 2 (two) times daily.   30 tablet   1   . isosorbide mononitrate (IMDUR) 30  MG 24 hr tablet   Oral   Take 1 tablet (30 mg total) by mouth daily.   30 tablet   1   . lisinopril (PRINIVIL,ZESTRIL) 40 MG tablet   Oral   Take 40 mg by mouth daily.         . metolazone (ZAROXOLYN) 2.5 MG tablet   Oral   Take 2.5 mg by mouth daily.         . metoprolol tartrate (LOPRESSOR) 25 MG tablet   Oral   Take 25 mg by mouth 2 (two) times daily.         . nitroGLYCERIN (NITROSTAT) 0.4 MG SL tablet   Sublingual   Place 1 tablet (0.4 mg total) under the tongue every 5 (five) minutes as needed. For chest pain. May take up to 3 doses.   20 tablet   5   . pantoprazole (PROTONIX) 40 MG tablet   Oral   Take 40 mg by mouth daily.         . Potassium Chloride ER 20 MEQ TBCR   Oral   Take 20 mEq by mouth daily.   30 tablet    1     While taking the lasix   . spironolactone (ALDACTONE) 25 MG tablet   Oral   Take 1 tablet (25 mg total) by mouth daily.   30 tablet   1   . torsemide (DEMADEX) 20 MG tablet   Oral   Take 20 mg by mouth 2 (two) times daily.           Allergies Review of patient's allergies indicates no known allergies.  Family History  Problem Relation Age of Onset  . CAD Father   . CAD Sister     Social History History  Substance Use Topics  . Smoking status: Former Smoker    Types: Cigarettes    Quit date: 01/20/1995  . Smokeless tobacco: Not on file  . Alcohol Use: No    Review of Systems  Constitutional: Negative for fever. Eyes: Negative for visual changes. ENT: Negative for sore throat. Cardiovascular: Negative for chest pain. Respiratory: Positive for acute on chronic shortness of breath. No sputum production Gastrointestinal: Negative for abdominal pain, vomiting and diarrhea. Genitourinary: Negative for dysuria. Musculoskeletal: Negative for back pain. Skin: Negative for rash. Daughter noticed there is a slight indentation in the skin of the forehead and scalp which is nontender Neurological: Negative for headaches 10 point Review of Systems otherwise negative ____________________________________________   PHYSICAL EXAM:  VITAL SIGNS: ED Triage Vitals  Enc Vitals Group     BP 02/25/15 0736 114/87 mmHg     Pulse Rate 02/25/15 0735 60     Resp 02/25/15 0735 22     Temp 02/25/15 0735 98.1 F (36.7 C)     Temp Source 02/25/15 0735 Oral     SpO2 02/25/15 0735 97 %     Weight 02/25/15 0735 205 lb (92.987 kg)     Height 02/25/15 0735 5\' 9"  (1.753 m)     Head Cir --      Peak Flow --      Pain Score 02/25/15 0736 8     Pain Loc --      Pain Edu? --      Excl. in [place]? --      Constitutional: Alert and cooperative.Marland Kitchen Heavy breathing and very dyspneic trying to speak. Eyes: Conjunctivae are normal. PERRL. Normal extraocular movements. ENT   Head:  Normocephalic and atraumatic.  Very small linear divot in the soft tissues over the forehead   Nose: No congestion/rhinnorhea.   Mouth/Throat: Mucous membranes are moist.   Neck: No stridor. Cardiovascular/Chest: Irregular and bradycardic. No murmurs, rubs, or gallops. Respiratory: Increased respiratory effort. No retractions. Decreased air movement bilateral bases. No wheezing noted. No rales or rhonchi.  Gastrointestinal: Soft. No distention, no guarding, no rebound. Nontender. Obese.  Genitourinary/rectal:Deferred  Musculoskeletal: Nontender with normal range of motion in all extremities. No joint effusions.  No lower extremity tenderness nor edema. Neurologic:  Normal speech and language. No gross focal neurologic deficits are appreciated. Skin:  Skin is warm, dry and intact. No rash noted. Psychiatric: Mood and affect are normal. Speech and behavior are normal. Patient exhibits appropriate insight and judgment.  ____________________________________________   EKG I, Lisa Roca, MD, the attending physician have personally viewed and interpreted all ECGs.  46 bpm. Sinus bradycardia with premature supraventricular beats. Right bundle branch block. Normal axis. T-wave inverted inferiorly and flattened laterally. ____________________________________________  LABS (pertinent positives/negatives)  Metabolic panel significant for BUN 25 and crying 1.01 CBC normal white blood cell count of 7.5 and hemoglobin 14.5 Troponin 0.04 BNP 2563  ____________________________________________  RADIOLOGY All Xrays were viewed by me. Imaging interpreted by Radiologist. CT of the chest was discussed with the radiologist by phone  Chest x-ray: Cardiac megaly status post CABG no active disease Ultrasound lower extremity lateral: Pending CT angios chest: Discussed with the radiologist: Multiple bilateral pulmonary emboli with evidence of right heart strain. Left pleural effusion. Large  intra-abdominal likely renal cell carcinoma mass  __________________________________________  PROCEDURES  Procedure(s) performed: None Critical Care performed: None  ____________________________________________   ED COURSE / ASSESSMENT AND PLAN  CONSULTATIONS: Face-to-face consultation with the hospitalist in the emergency department for admission  Pertinent labs & imaging results that were available during my care of the patient were reviewed by me and considered in my medical decision making (see chart for details).   Patient is having dyspnea despite hypoxia. Clinically he looks like he probably has congestive heart failure with increased lower extremity edema, however his chest x-ray is clear and is not hypoxic. His BNP was elevated. However I did choose to do a CT scan to rule out PE, and after talking with radiology, there are multiple bilateral PEs with right heart strain. Patient was started on heparin. There was an incompletely imaged/evaluated mass in the abdomen which will need to be further evaluated. I did start the discussed with the family for that.  Patient / Family / Caregiver informed of clinical course, medical decision-making process, and agree with plan.   I discussed return precautions, follow-up instructions, and discharged instructions with patient and/or family.  ___________________________________________   FINAL CLINICAL IMPRESSION(S) / ED DIAGNOSES   Final diagnoses:  Pulmonary emboli  Pleural effusion on left  Renal mass      Lisa Roca, MD 02/25/15 1117

## 2015-02-25 NOTE — Plan of Care (Signed)
Problem: Discharge Progression Outcomes Goal: Pneumonia vaccine received if indicated Pt is on room air, using urinal, fair appetite, denies pain, MR of abdomen performed, vital signs stable, on heparin gtt and receiving IV antibiotics.

## 2015-02-26 DIAGNOSIS — N2889 Other specified disorders of kidney and ureter: Secondary | ICD-10-CM | POA: Insufficient documentation

## 2015-02-26 LAB — BASIC METABOLIC PANEL
ANION GAP: 11 (ref 5–15)
BUN: 23 mg/dL — AB (ref 6–20)
CHLORIDE: 108 mmol/L (ref 101–111)
CO2: 22 mmol/L (ref 22–32)
Calcium: 9.7 mg/dL (ref 8.9–10.3)
Creatinine, Ser: 1.1 mg/dL (ref 0.61–1.24)
GFR calc non Af Amer: >60 mL/min (ref >60)
Glucose, Bld: 109 mg/dL — ABNORMAL HIGH (ref 65–99)
Potassium: 3.8 mmol/L (ref 3.5–5.1)
SODIUM: 141 mmol/L (ref 135–145)

## 2015-02-26 LAB — CBC
HEMATOCRIT: 42 % (ref 40.0–52.0)
Hemoglobin: 14 g/dL (ref 13.0–18.0)
MCH: 29 pg (ref 26.0–34.0)
MCHC: 33.4 g/dL (ref 32.0–36.0)
MCV: 86.7 fL (ref 80.0–100.0)
Platelets: 119 10*3/uL — ABNORMAL LOW (ref 150–440)
RBC: 4.85 MIL/uL (ref 4.40–5.90)
RDW: 14.4 % (ref 11.5–14.5)
WBC: 8.8 10*3/uL (ref 3.8–10.6)

## 2015-02-26 LAB — PROTIME-INR
INR: 1.3
Prothrombin Time: 16.4 seconds — ABNORMAL HIGH (ref 11.4–15.0)

## 2015-02-26 LAB — TROPONIN I: Troponin I: 0.04 ng/mL — ABNORMAL HIGH (ref <0.031)

## 2015-02-26 LAB — HEPARIN LEVEL (UNFRACTIONATED)
HEPARIN UNFRACTIONATED: 0.49 [IU]/mL (ref 0.30–0.70)
Heparin Unfractionated: 0.64 IU/mL (ref 0.30–0.70)

## 2015-02-26 LAB — TSH: TSH: 2.723 u[IU]/mL (ref 0.350–4.500)

## 2015-02-26 MED ORDER — SODIUM CHLORIDE 0.9 % IJ SOLN
3.0000 mL | INTRAMUSCULAR | Status: DC | PRN
Start: 2015-02-26 — End: 2015-02-28
  Administered 2015-02-27 – 2015-02-28 (×3): 3 mL via INTRAVENOUS
  Filled 2015-02-26 (×3): qty 10

## 2015-02-26 MED ORDER — POTASSIUM CHLORIDE CRYS ER 20 MEQ PO TBCR
20.0000 meq | EXTENDED_RELEASE_TABLET | Freq: Every day | ORAL | Status: DC
  Administered 2015-02-26 – 2015-02-28 (×3): 20 meq via ORAL
  Filled 2015-02-26 (×3): qty 1

## 2015-02-26 NOTE — Progress Notes (Signed)
HR dropping into high 30s when patient is sleeping. Will stop Metoprolol. Check TSH with AM labs.

## 2015-02-26 NOTE — Clinical Social Work Note (Signed)
CSW was able to verify that pt does have an application in for PACE program.

## 2015-02-26 NOTE — Progress Notes (Signed)
[place] [facility] [place] NAME: Dillon [name]    MR#:  [id number]  DATE OF BIRTH:  [birth year]  SUBJECTIVE:  Came in with right leg swellig and found to have PE. Feels weak  REVIEW OF SYSTEMS:   Review of Systems  Constitutional: Positive for malaise/fatigue. Negative for fever, chills and weight loss.  HENT: Negative for ear discharge, ear pain and nosebleeds.   Eyes: Negative for blurred vision, pain and discharge.  Respiratory: Positive for shortness of breath. Negative for sputum production, wheezing and stridor.   Cardiovascular: Negative for chest pain, palpitations, orthopnea and PND.  Gastrointestinal: Negative for nausea, vomiting, abdominal pain and diarrhea.  Genitourinary: Negative for urgency and frequency.  Musculoskeletal: Negative for back pain and joint pain.  Neurological: Positive for weakness. Negative for sensory change, speech change and focal weakness.  Psychiatric/Behavioral: Negative for depression. The patient is not nervous/anxious.   All other systems reviewed and are negative.  Tolerating Diet:yes Tolerating PT: peding  DRUG ALLERGIES:  No Known Allergies  VITALS:  Blood pressure 100/54, pulse 54, temperature 98.1 F (36.7 C), temperature source Oral, resp. rate 20, height 5\' 9"  (1.753 m), weight 96.54 kg (212 lb 13.3 oz), SpO2 97 %.  PHYSICAL EXAMINATION:   Physical Exam  GENERAL:  69 y.o.-year-old patient lying in the bed with no acute distress.  EYES: Pupils equal, round, reactive to light and accommodation. No scleral icterus. Extraocular muscles intact.  HEENT: Head atraumatic, normocephalic. Oropharynx and nasopharynx clear.  NECK:  Supple, no jugular venous distention. No thyroid enlargement, no tenderness.  LUNGS: Normal breath sounds bilaterally, no wheezing, rales, rhonchi. No use of accessory muscles of respiration.  CARDIOVASCULAR: S1, S2 normal. No murmurs, rubs, or gallops.  ABDOMEN:  Soft, nontender, nondistended. Bowel sounds present. No organomegaly or mass.  EXTREMITIES: No cyanosis, clubbing  Bilateral 2+ edema. NEUROLOGIC: Cranial nerves II through XII are intact. No focal Motor or sensory deficits b/l.   PSYCHIATRIC:  alert and oriented x 3.  SKIN: No obvious rash, lesion, or ulcer.   LABORATORY PANEL:   CBC  Recent Labs Lab 02/26/15 0119  WBC 8.8  HGB 14.0  HCT 42.0  PLT 119*    Chemistries   Recent Labs Lab 02/26/15 0119  NA 141  K 3.8  CL 108  CO2 22  GLUCOSE 109*  BUN 23*  CREATININE 1.10  CALCIUM 9.7    Cardiac Enzymes  Recent Labs Lab 02/26/15 0319  TROPONINI 0.04*    RADIOLOGY:  Ct Angio Chest Pe W/cm &/or Wo Cm  02/25/2015   CLINICAL DATA:  Swelling of the legs and shortness of breath. Low oxygen saturation.  EXAM: CT ANGIOGRAPHY CHEST WITH CONTRAST  TECHNIQUE: Multidetector CT imaging of the chest was performed using the standard protocol during bolus administration of intravenous contrast. Multiplanar CT image reconstructions and MIPs were obtained to evaluate the vascular anatomy.  CONTRAST:  160mL OMNIPAQUE IOHEXOL 350 MG/ML SOLN  COMPARISON:  Radiography same day  FINDINGS: Pulmonary arterial opacification is excellent. Study is positive for bilateral pulmonary emboli. RV to LV ratio measures 1.15, consistent with right heart strain.  There is a left pleural effusion layering dependently. There is been previous median sternotomy. There is extensive coronary artery calcification and coronary stents.  No hilar or mediastinal mass or lymphadenopathy. No primary pulmonary lesion. Scans in the upper abdomen show an apparent mass lesion of the left kidney, not completely imaged, but quite likely to represent renal cell  cancer. There is a small amount of ascites.  Review of the MIP images confirms the above findings.  IMPRESSION: Positive for acute PE with CTevidence of right heart strain (RV/LV Ratio = 1.15) consistent with at least  submassive (intermediate risk) PE. The presence of right heart strain has been associated with an increased risk of morbidity and mortality.  Probable large left renal mass, not completely evaluated.  These results were called by telephone at the time of interpretation on 02/25/2015 at 11:13 am to Dr. Lisa Roca , who verbally acknowledged these results.   Electronically Signed   By: Nelson Chimes M.D.   On: 02/25/2015 11:16   Mr Jodene Nam Abdomen W Wo Contrast  02/26/2015   CLINICAL DATA:  69 year old male currently admitted with pulmonary emboli and lower extremity edema. Bilateral lower extremity duplex ultrasound was negative for residual DVT. Large left renal mass noted on CT PE study. Evaluate for left renal vein tumor thrombus as well as central DVT.  EXAM: MRA ABDOMEN WITH OR WITHOUT CONTRAST  TECHNIQUE: Angiographic images of the chest were obtained using MRA technique without and with intravenous contrast.  CONTRAST:  4mL MULTIHANCE GADOBENATE DIMEGLUMINE 529 MG/ML IV SOLN  COMPARISON:  Lower extremity duplex ultrasound 02/25/2015  FINDINGS: VASCULAR  All visualized segments of the inferior vena cava from the intrahepatic segment to the bifurcation are widely patent. There is no external compression or evidence of thrombus. There are 2 right and a solitary left renal vein. All renal veins are widely patent. No evidence of tumor extension from the left renal mass into the left renal vein. Of note, there is a hypertrophic left lumbar artery likely secondary to the presence of the large left renal mass and associated increased venous return.  The aorta is normal in caliber without evidence of aneurysm or dissection. Unremarkable celiac artery, superior mesenteric artery and bilateral dominant renal arteries.  NON VASCULAR  There is a large centrally necrotic lobular exophytic enhancing mass arising from the medial upper pole of the left kidney. The mass is somewhat difficult to measure given the irregular shape  but measures approximately 8.6 x 6.9 x 7.2 cm. There is no evidence of invasion into the renal vein. Neovascularity is present surrounding the mass in the retroperitoneal space and there is hypertrophy of a left lumbar artery arising from the posterior aspect of the left renal vein. T2 hyperintense, T1 dark a nonenhancing simple cyst measuring 1.3 cm in the interpolar right kidney. No additional enhancing renal mass identified in either kidney. No evidence of hydronephrosis.  No definitive periaortic adenopathy or other evidence of distant metastatic disease. Of note, the examination is moderately degraded by motion.  IMPRESSION: VASCULAR  1. No evidence of caval or proximal iliac DVT. The more distal common and external iliac veins are not included in the field of view of an MRV of the abdomen. 2. No evidence of bland or tumor thrombus within the left renal vein. NON VASCULAR  1. Large, irregular and lobular 8.6 x 6.9 x 7.2 cm enhancing centrally necrotic mass exophytic from the medial upper pole of the left kidney. The imaging appearance is most consistent with a renal cell carcinoma. No evidence of renal vein invasion, suspicious adenopathy or distant metastatic disease within the imaged field of view. Recommend consultation with urology. 2. 1.3 cm simple cyst in the interpolar right kidney.  Signed,  Criselda Peaches, MD  [facility]  [facility]   Electronically Signed   By:  Jacqulynn Cadet M.D.   On: 02/26/2015 08:04   US Venous Img Lower Bilateral  02/25/2015   CLINICAL DATA:  Swelling, right greater than left, x1 year. Pain. On aspirin.  EXAM: BILATERAL LOWER EXTREMITY VENOUS DOPPLER ULTRASOUND  TECHNIQUE: Gray-scale sonography with compression, as well as color and duplex ultrasound, were performed to evaluate the deep venous system from the level of the common femoral vein through the popliteal and proximal calf veins.  COMPARISON:  01/21/2015   FINDINGS: Normal compressibility of the common femoral, superficial femoral, and popliteal veins, as well as the proximal calf veins. No filling defects to suggest DVT on grayscale or color Doppler imaging. Doppler waveforms show normal direction of venous flow, normal respiratory phasicity and response to augmentation. Visualized segments of the saphenous venous systems normal in caliber and compressibility.  IMPRESSION: 1. No evidence of lower extremity deep vein thrombosis, BILATERALLY.   Electronically Signed   By: Lucrezia Europe M.D.   On: 02/25/2015 11:30   Dg Chest Port 1 View  02/25/2015   CLINICAL DATA:  Right leg swelling  EXAM: PORTABLE CHEST - 1 VIEW  COMPARISON:  02/11/2015  FINDINGS: Cardiomegaly again noted. Status post CABG. No acute infiltrate or pulmonary edema. Bony thorax is stable.  IMPRESSION: Cardiomegaly.  Status post CABG.  No active disease.   Electronically Signed   By: Lahoma Crocker M.D.   On: 02/25/2015 08:17     ASSESSMENT AND PLAN:  69 y.o. male with a known history of recent stroke. Came in with swelling of his right lower extremity and trouble breathing since Thursday  1. Submassive pulmonary embolism. Risk is recent hospitalization and also renal mass. Since the patient did have a recent stroke, not a candidate for TPA.  -IV heparin gtt with po coumadin  2. Elevated troponin likely from demand ischemia with pulmonary embolism.  3. Recent stroke with right-sided weakness- continue aspirin. Hold Plavix since the patient is on IV heparin.  4. Decreased pulses right lower extremity and cool extremity- I will speak with vascular surgery to evaluate the patient.  5. Renal mass- appear renal cell carcinoma. Spoke with Dr Erlene Quan and given all his recent co-morbidities does not recommend any further w/u due his high risk for any aggressive procedure Daughter agreeable with plan  6. hyperlipidemia continue atorvastatin  7. Edema lower extremities continue Lasix.  PT to  evaluate.  Pt will benefit from Rehab if pt participates CSW/CM to work with d/c planing  Case discussed with Care Management/Social Worker. Management plans discussed with the patient, family and they are in agreement.  CODE STATUS: DNR  DVT Prophylaxis: Heparin gtt  TOTAL TIME TAKING CARE OF THIS PATIENT: 35 minutes.  >50% time spent on counselling and coordination of care  POSSIBLE D/C IN 2-3DAYS, DEPENDING ON CLINICAL CONDITION.   Caden Fukushima M.D on 02/26/2015 at 11:13 AM  Between 7am to 6pm - Pager - [phone]  After 6pm go to [email or web address] - password [email or web address] [facility]  [facility]  Office  [phone]  CC: Primary care physician; No PCP Per Patient

## 2015-02-26 NOTE — Care Management (Signed)
Attending spoke with patient's daughter and any aggressive treatment for the renal tumor will not be pursued.  Patient is on heparin drip and coumadin.  When his INR is therapeutic, will discharge.  Daughter wishes to pursue SNF.

## 2015-02-26 NOTE — Progress Notes (Addendum)
ANTICOAGULATION CONSULT NOTE - Follow Up Consult  Pharmacy Consult for Heparin/warfarin Indication: pulmonary embolus  No Known Allergies  Patient Measurements: Height: 5\' 9"  (175.3 cm) Weight: 212 lb 13.3 oz (96.54 kg) IBW/kg (Calculated) : 70.7 Heparin Dosing Weight: 89 kg  Vital Signs: Temp: 98.1 F (36.7 C) (06/28 0905) Temp Source: Oral (06/28 0905) BP: 122/54 mmHg (06/28 0905) Pulse Rate: 55 (06/28 0905)  Labs:  Recent Labs  02/25/15 0808 02/25/15 1428 02/25/15 1803 02/25/15 2059 02/26/15 0119 02/26/15 0319 02/26/15 0729  HGB 14.5  --   --   --  14.0  --   --   HCT 44.2  --   --   --  42.0  --   --   PLT 120*  --   --   --  119*  --   --   APTT 31  --   --   --   --   --   --   LABPROT 15.6*  --   --   --  16.4*  --   --   INR 1.22  --   --   --  1.30  --   --   HEPARINUNFRC  --   --  0.16*  --  0.49  --  0.64  CREATININE 1.01  --   --   --  1.10  --   --   TROPONINI 0.04* 0.04*  --  0.04*  --  0.04*  --     Estimated Creatinine Clearance: 73.6 mL/min (by C-G formula based on Cr of 1.1).   Medications:  Scheduled:  . aspirin EC  81 mg Oral Daily  . atorvastatin  40 mg Oral QPM  . cefTRIAXone (ROCEPHIN)  IV  1 g Intravenous Q24H  . furosemide  40 mg Oral BID  . isosorbide mononitrate  30 mg Oral Daily  . lisinopril  40 mg Oral Daily  . pantoprazole  40 mg Oral Daily  . pneumococcal 23 valent vaccine  0.5 mL Intramuscular Tomorrow-1000  . Potassium Chloride ER  20 mEq Oral Daily  . sodium chloride  3 mL Intravenous Q12H  . spironolactone  25 mg Oral Daily  . warfarin  7.5 mg Oral q morning - 10a  . Warfarin - Physician Dosing Inpatient   Does not apply q1800   Infusions:  . heparin 1,800 Units/hr (02/25/15 1838)   PRN: acetaminophen **OR** acetaminophen, morphine injection, nitroGLYCERIN, oxyCODONE  Assessment: Patient on heparin drip for PE with warfarin overlap.  Patient currently ordered heparin drip at rate of 1800 units/hr and warfarin 7.5  mg po daily.    INR today of 1.3  Goal of Therapy:  Heparin level 0.3-0.7 units/ml Monitor platelets by anticoagulation protocol: Yes   Plan:  Anti-Xa at 0727 of 0.64.  Continue current rate of 1800 units/hr.  Will continue current orders for warfarin 7.5 mg daily as patient has only received 1 dose of warfarin.  Will recheck Anti-Xa level, CBC, and INR in AM.  Pharmacy will continue to follow.  Murrell Converse, PharmD Clinical Pharmacist 02/26/2015    6/29 AM anti-Xa 0.71. Decrease to 1700 units/hr and recheck in 6 hours.  Sim Boast, PharmD, BCPS  02/27/2015

## 2015-02-26 NOTE — Progress Notes (Signed)
Pt heart rate dropping to mids 30s-50s. Pt checked and is sleeping. MD notified. Acknowledged and new orders. Will continue to monitor.

## 2015-02-26 NOTE — Clinical Social Work Note (Signed)
Clinical Social Work Assessment  Patient Details  Name: Dillon [name] MRN: [id number] Date of Birth: Aug 26, 1946  Date of referral:  02/26/15               Reason for consult:  Facility Placement                Permission sought to share information with:  Facility Sport and exercise psychologist, Family Supports Permission granted to share information::  Yes, Verbal Permission Granted  Name::        Agency::     Relationship::     Contact Information:     Housing/Transportation Living arrangements for the past 2 months:  Single Family Home Source of Information:  Patient, Adult Children, Medical Team Patient Interpreter Needed:  None Criminal Activity/Legal Involvement Pertinent to Current Situation/Hospitalization:  No - Comment as needed Significant Relationships:  Adult Children Lives with:  Adult Children Do you feel safe going back to the place where you live?  No Need for family participation in patient care:  Yes (Comment)  Care giving concerns:  CSW spoke to pt.  He was able to answer orienration questions, however he was still confused.  Answers were not always relative to the questions being asked.  CSW was given permission to talk to pt's daughter.  She is in agreement with DC to SNF.  She recently took pt out of a group home to live with her and her husband.  Per pt's daughter pt has been resistant to PT at home since he was taken out of the group home.   CSW clarified that pt will need to work with PT if he is going to DC to a SNF.  Pt's daughter stated that she understood this.  CSW also confirmed with pt that he was in agreement with participating in rehab.  He stated that he was during this assessment.  Pt stated that he is in agreement with SNF placement.  CSW will also f/u with [facility] to confirm application for PACE program.     Social Worker assessment / plan:  CSW spoke to pt.  He was able to answer orienration questions, however he was still confused.  Answers were not always  relative to the questions being asked.  CSW was given permission to talk to pt's daughter.  She is in agreement with DC to SNF.  She recently took pt out of a group home to live with her and her husband.  Per pt's daughter pt has been resistant to PT at home since he was taken out of the group home.   CSW clarified that pt will need to work with PT if he is going to DC to a SNF.  Pt's daughter stated that she understood this.  CSW also confirmed with pt that he was in agreement with participating in rehab.  He stated that he was during this assessment.  Pt stated that he is in agreement with SNF placement.  CSW will also f/u with [facility] to confirm application for PACE program.     Social Worker assessment / plan:  CSW spoke to pt.  He was able to answer orienration questions, however he was still confused.  Answers were not always relative to the questions being asked.  CSW was given permission to talk to pt's daughter.  She is in agreement with DC to SNF.  She recently took pt out of a group home to live with her and her husband.  Per pt's daughter pt has been  resistant to PT at home since he was taken out of the group home.   CSW clarified that pt will need to work with PT if he is going to DC to a SNF.  Pt's daughter stated that she understood this.  CSW also confirmed with pt that he was in agreement with participating in rehab.  He stated that he was during this assessment.  Pt stated that he is in agreement with SNF placement.  CSW will also f/u with [facility] to confirm application for PACE program.  Employment status:  Disabled (Comment on whether or not currently receiving Disability), Retired Forensic scientist:    PT Recommendations:    Information / Referral to community resources:     Patient/Family's Response to care:  Pt and pt's daughter were in agreement with SNF placement.   Patient/Family's Understanding of and Emotional Response to Diagnosis, Current Treatment, and Prognosis:  Pt's  daughter understood the recommendation for SNF placement.    Emotional Assessment Appearance:  Appears older than stated age Attitude/Demeanor/Rapport:   (confused) Affect (typically observed):  Flat Orientation:  Oriented to Self, Oriented to  Time, Oriented to Place Alcohol / Substance use:  Never Used Psych involvement (Current and /or in the community):  No (Comment)  Discharge Needs  Concerns to be addressed:  Care Coordination, Home Safety Concerns Readmission within the last 30 days:  Yes Current discharge risk:  Physical Impairment Barriers to Discharge:      Mathews Argyle, LCSW 02/26/2015, 12:12 PM

## 2015-02-26 NOTE — Progress Notes (Signed)
[facility]  Daily Progress [name]   Subjective  - * No surgery found *  Feeling better. Not having a lot of pain On heparin for PE Right foot about the same.  2+ swelling.  No palpable pulses, but not acutely threatened.   Objective Filed Vitals:   02/25/15 1200 02/25/15 1306 02/25/15 2010 02/26/15 0443  BP: 131/85 138/81 109/56 106/48  Pulse: 46 50 49 31  Temp:  97.8 F (36.6 C) 97.8 F (36.6 C) 97.8 F (36.6 C)  TempSrc:  Oral Oral Oral  Resp: 25 24 22 22   Height:  5\' 9"  (1.753 m)    Weight:  214 lb 6.4 oz (97.251 kg)  212 lb 13.3 oz (96.54 kg)  SpO2: 99% 96% 94% 95%    Intake/Output Summary (Last 24 hours) at 02/26/15 0827 Last data filed at 02/26/15 0412  Gross per 24 hour  Intake  75.75 ml  Output   1250 ml  Net -1174.25 ml    PULM  CTAB CV  RRR VASC  Right foot as above  Laboratory CBC    Component Value Date/Time   WBC 8.8 02/26/2015 0119   WBC 7.9 04/12/2014 0452   HGB 14.0 02/26/2015 0119   HGB 13.0 04/12/2014 0452   HCT 42.0 02/26/2015 0119   HCT 40.0 04/12/2014 0452   PLT 119* 02/26/2015 0119   PLT 143* 04/12/2014 0452    BMET    Component Value Date/Time   NA 141 02/26/2015 0119   NA 141 04/12/2014 0452   K 3.8 02/26/2015 0119   K 3.9 04/12/2014 0452   CL 108 02/26/2015 0119   CL 102 04/12/2014 0452   CO2 22 02/26/2015 0119   CO2 29 04/12/2014 0452   GLUCOSE 109* 02/26/2015 0119   GLUCOSE 124* 04/12/2014 0452   BUN 23* 02/26/2015 0119   BUN 21* 04/12/2014 0452   CREATININE 1.10 02/26/2015 0119   CREATININE 1.16 04/12/2014 0452   CALCIUM 9.7 02/26/2015 0119   CALCIUM 8.6 04/12/2014 0452   GFRNONAA >60 02/26/2015 0119   GFRNONAA >60 04/12/2014 0452   GFRAA >60 02/26/2015 0119   GFRAA >60 04/12/2014 0452    Assessment/Planning: Multiple ongoing issues 1. PE:  On anticoagulation.  Will need to continue.  No LE DVT, no need for IVC filter at current if tolerating anticoagulation 2. RLE pain and edema.  Has  likely chronic PAD and swelling from immobility and cardiac issues.  At some point may need angiogram or non-invasives as an outpatient 3. Symptomatic high grade left ICA stenosis with stroke 2-3 weeks ago.  With other issues and cardiac problems and need for continued anticoagulation with PE, would probably consider left carotid stent placement instead of CEA.  Timing is debatable, but probably some time in next week or two.  Multiple issues in a complex and difficult patient.        Dillon [name]  02/26/2015, 8:27 AM

## 2015-02-26 NOTE — Progress Notes (Signed)
Date: 02/26/2015,   MRN# [id number] Dillon [name] [birth year] Code Status:     Code Status Orders        Start     Ordered   02/25/15 1230  Do not attempt resuscitation (DNR)   Continuous    Question Answer Comment  In the event of cardiac or respiratory ARREST Do not call a "code blue"   In the event of cardiac or respiratory ARREST Do not perform Intubation, CPR, defibrillation or ACLS   In the event of cardiac or respiratory ARREST Use medication by any route, position, wound care, and other measures to relive pain and suffering. May use oxygen, suction and manual treatment of airway obstruction as needed for comfort.   Comments Nurse may pronounce      02/25/15 1230     Hosp day:@LENGTHOFSTAYDAYS @ Referring MD: @ATDPROV @         HPI: up eating, off oxygen, no new complaints, B/p holding, no bleeding, see last note  PMHX:   Past Medical History  Diagnosis Date  . Acute MI   . Coronary artery disease   . CHF (congestive heart failure)   . Hypertension   . Hyperlipidemia   . Stroke    Surgical Hx:  Past Surgical History  Procedure Laterality Date  . Cardiac catheterization    . Coronary artery bypass graft    . Thumb fusion     Family Hx:  Family History  Problem Relation Age of Onset  . CAD Father   . CAD Sister   . Bone cancer Mother   . Bone cancer Brother    Social Hx:   History  Substance Use Topics  . Smoking status: Former Smoker    Types: Cigarettes    Quit date: 01/20/1995  . Smokeless tobacco: Former Systems developer    Types: Chew  . Alcohol Use: No   Medication:    Home Medication:  No current outpatient prescriptions on file.  Current Medication: @CURMEDTAB @   Allergies:  Review of patient's allergies indicates no known allergies.  Review of Systems: Gen:  Denies  fever, sweats, chills HEENT: Denies blurred vision, double vision, ear pain, eye pain, hearing loss, nose bleeds, sore throat Cvc:  No dizziness, chest pain or heaviness Resp:   No pleurisy or hemoptysi  Gi: Denies swallowing difficulty, stomach pain, nausea or vomiting, diarrhea, constipation, bowel incontinence Gu:  Denies bladder incontinence, burning urine Ext:   No Joint pain, stiffness or swelling Skin: No skin rash, easy bruising or bleeding or hives Endoc:  No polyuria, polydipsia , polyphagia or weight change Psych: No depression, insomnia or hallucinations  Other:  All other systems negative  Physical Examination:   VS: BP 118/58 mmHg  Pulse 53  Temp(Src) 98.7 F (37.1 C) (Oral)  Resp 20  Ht 5\' 9"  (1.753 m)  Wt 212 lb 13.3 oz (96.54 kg)  BMI 31.42 kg/m2  SpO2 100%  General Appearance: No distress, eating, off oxygen Neuro: without focal findings, mental status, speech normal, alert and oriented, cranial nerves 2-12 intact, reflexes normal and symmetric, sensation grossly normal  HEENT: PERRLA, EOM intact, no ptosis, no other lesions noticed Pulmonary:.No wheezing, No rales  Sputum Production:   Cardiovascular:  Normal S1,S2.  No m/r/g.   Abdomen:Benign, Soft, non-tender, No masses, hepatosplenomegaly, No lymphadenopathy Endoc: No evident thyromegaly, no signs of acromegaly or Cushing features Skin:   warm, no rashes, no ecchymosis  Extremities: right leg larger the the left, no ulcerations Other findings:   Labs  results:   Recent Labs     02/25/15  0808  02/26/15  0119  HGB  14.5  14.0  HCT  44.2  42.0  MCV  88.1  86.7  WBC  7.5  8.8  BUN  25*  23*  CREATININE  1.01  1.10  GLUCOSE  153*  109*  CALCIUM  9.6  9.7  INR  1.22  1.30  ,      Rad results:   Mr Jodene Nam Abdomen W Wo Contrast  02/26/2015   CLINICAL DATA:  69 year old male currently admitted with pulmonary emboli and lower extremity edema. Bilateral lower extremity duplex ultrasound was negative for residual DVT. Large left renal mass noted on CT PE study. Evaluate for left renal vein tumor thrombus as well as central DVT.  EXAM: MRA ABDOMEN WITH OR WITHOUT CONTRAST   TECHNIQUE: Angiographic images of the chest were obtained using MRA technique without and with intravenous contrast.  CONTRAST:  79mL MULTIHANCE GADOBENATE DIMEGLUMINE 529 MG/ML IV SOLN  COMPARISON:  Lower extremity duplex ultrasound 02/25/2015  FINDINGS: VASCULAR  All visualized segments of the inferior vena cava from the intrahepatic segment to the bifurcation are widely patent. There is no external compression or evidence of thrombus. There are 2 right and a solitary left renal vein. All renal veins are widely patent. No evidence of tumor extension from the left renal mass into the left renal vein. Of note, there is a hypertrophic left lumbar artery likely secondary to the presence of the large left renal mass and associated increased venous return.  The aorta is normal in caliber without evidence of aneurysm or dissection. Unremarkable celiac artery, superior mesenteric artery and bilateral dominant renal arteries.  NON VASCULAR  There is a large centrally necrotic lobular exophytic enhancing mass arising from the medial upper pole of the left kidney. The mass is somewhat difficult to measure given the irregular shape but measures approximately 8.6 x 6.9 x 7.2 cm. There is no evidence of invasion into the renal vein. Neovascularity is present surrounding the mass in the retroperitoneal space and there is hypertrophy of a left lumbar artery arising from the posterior aspect of the left renal vein. T2 hyperintense, T1 dark a nonenhancing simple cyst measuring 1.3 cm in the interpolar right kidney. No additional enhancing renal mass identified in either kidney. No evidence of hydronephrosis.  No definitive periaortic adenopathy or other evidence of distant metastatic disease. Of note, the examination is moderately degraded by motion.  IMPRESSION: VASCULAR  1. No evidence of caval or proximal iliac DVT. The more distal common and external iliac veins are not included in the field of view of an MRV of the abdomen. 2.  No evidence of bland or tumor thrombus within the left renal vein. NON VASCULAR  1. Large, irregular and lobular 8.6 x 6.9 x 7.2 cm enhancing centrally necrotic mass exophytic from the medial upper pole of the left kidney. The imaging appearance is most consistent with a renal cell carcinoma. No evidence of renal vein invasion, suspicious adenopathy or distant metastatic disease within the imaged field of view. Recommend consultation with urology. 2. 1.3 cm simple cyst in the interpolar right kidney.  Signed,  D[facility] Peaches, MD  [facility]  [facility]   Electronically Signed   By: [facility] [name] M.D.   On: 02/26/2015 08:04     Assessment and Plan: This is a 79 year ols male with a recent stroke ( 2 weeks ago), came  back in with bilateral pulmonary embolism, no dvt. His b/p and oxygenation is maintained. Beside that he had a recent stroke. Hence thrombolytic is a realitive contraindication. He is doing better today.  -continue heparin, start coumidin 02/26/15 -no IVC filter placement -following vitals and sats   ? Left renal mass, c/ w renal cell cancer beside this,  his difficulty walking, this may be also another reason for hypercoagulable state -urology consult  Cardiomyopathy, EF 20-25 %, surprisingly he is handling this heavy burden of clot -watch salt and fluid -diuresis, after loading  Decrease circulation to right lower extremity  -vascular following   I have personally obtained a history, examined the patient, evaluated laboratory and imaging results, formulated the assessment and plan and placed orders.  The Patient requires high complexity decision making for assessment and support, frequent evaluation and titration of therapies, application of advanced monitoring technologies and extensive interpretation of multiple databases.   Broadus Costilla,M.D. Pulmonary & Critical care Medicine [facility]

## 2015-02-26 NOTE — Clinical Social Work Placement (Signed)
   CLINICAL SOCIAL WORK PLACEMENT  NOTE  Date:  02/26/2015  Patient Details  Name: Dillon [name] MRN: [id number] Date of Birth: 31-Jan-1946  Clinical Social Work is seeking post-discharge placement for this patient at the [place] level of care (*CSW will initial, date and re-position this form in  chart as items are completed):  Yes   Patient/family provided with [place] Work Department's list of facilities offering this level of care within the geographic area requested by the patient (or if unable, by the patient's family).  Yes   Patient/family informed of their freedom to choose among providers that offer the needed level of care, that participate in Medicare, Medicaid or managed care program needed by the patient, have an available bed and are willing to accept the patient.  Yes   Patient/family informed of [facility]'s ownership interest in [facility] and [facility], as well as of the fact that they are under no obligation to receive care at these facilities.  PASRR submitted to EDS on 02/26/15     PASRR number received on 02/26/15     Existing PASRR number confirmed on       FL2 transmitted to all facilities in geographic area requested by pt/family on 02/26/15     FL2 transmitted to all facilities within larger geographic area on       Patient informed that his/her managed care company has contracts with or will negotiate with certain facilities, including the following:            Patient/family informed of bed offers received.  Patient chooses bed at       Physician recommends and patient chooses bed at      Patient to be transferred to   on  .  Patient to be transferred to facility by       Patient family notified on   of transfer.  Name of family member notified:        PHYSICIAN Please sign FL2     Additional Comment:   CSW will f/u once offers have been  received. _______________________________________________ Mathews Argyle, LCSW 02/26/2015, 2:10 PM

## 2015-02-27 LAB — URINE CULTURE: Special Requests: NORMAL

## 2015-02-27 LAB — PROTIME-INR
INR: 1.36
Prothrombin Time: 17 seconds — ABNORMAL HIGH (ref 11.4–15.0)

## 2015-02-27 LAB — CBC
HCT: 40.6 % (ref 40.0–52.0)
HEMOGLOBIN: 13.6 g/dL (ref 13.0–18.0)
MCH: 28.8 pg (ref 26.0–34.0)
MCHC: 33.4 g/dL (ref 32.0–36.0)
MCV: 86.2 fL (ref 80.0–100.0)
PLATELETS: 119 10*3/uL — AB (ref 150–440)
RBC: 4.71 MIL/uL (ref 4.40–5.90)
RDW: 14.6 % — ABNORMAL HIGH (ref 11.5–14.5)
WBC: 9.8 10*3/uL (ref 3.8–10.6)

## 2015-02-27 LAB — HEPARIN LEVEL (UNFRACTIONATED)
Heparin Unfractionated: 0.71 IU/mL — ABNORMAL HIGH (ref 0.30–0.70)
Heparin Unfractionated: 0.81 IU/mL — ABNORMAL HIGH (ref 0.30–0.70)
Heparin Unfractionated: 0.47 IU/mL (ref 0.30–0.70)

## 2015-02-27 MED ORDER — CEFUROXIME AXETIL 500 MG PO TABS
500.0000 mg | ORAL_TABLET | Freq: Two times a day (BID) | ORAL | Status: DC
  Administered 2015-02-27 – 2015-02-28 (×3): 500 mg via ORAL
  Filled 2015-02-27 (×6): qty 1

## 2015-02-27 MED ORDER — HEPARIN (PORCINE) IN NACL 100-0.45 UNIT/ML-% IJ SOLN
1500.0000 [IU]/h | INTRAMUSCULAR | Status: AC
  Administered 2015-02-27 – 2015-02-28 (×2): 1500 [IU]/h via INTRAVENOUS
  Filled 2015-02-27 (×3): qty 250

## 2015-02-27 MED ORDER — ALUM & MAG HYDROXIDE-SIMETH 200-200-20 MG/5ML PO SUSP
30.0000 mL | Freq: Four times a day (QID) | ORAL | Status: DC | PRN
  Administered 2015-02-27: 30 mL via ORAL
  Filled 2015-02-27: qty 30

## 2015-02-27 MED ORDER — WARFARIN SODIUM 5 MG PO TABS
10.0000 mg | ORAL_TABLET | Freq: Every day | ORAL | Status: DC
  Administered 2015-02-27: 10 mg via ORAL
  Filled 2015-02-27: qty 2

## 2015-02-27 MED ORDER — WARFARIN - PHARMACIST DOSING INPATIENT: Freq: Every day | Status: DC

## 2015-02-27 NOTE — Progress Notes (Signed)
ANTICOAGULATION CONSULT NOTE - Follow Up Consult  Pharmacy Consult for Heparin/Warfarin Indication: pulmonary embolus  No Known Allergies  Patient Measurements: Height: 5\' 9"  (175.3 cm) Weight: 212 lb 13.3 oz (96.54 kg) IBW/kg (Calculated) : 70.7 Heparin Dosing Weight: 89 kg  Vital Signs: Temp: 97.7 F (36.5 C) (06/29 1057) Temp Source: Oral (06/29 1057) BP: 133/70 mmHg (06/29 1057) Pulse Rate: 57 (06/29 1057)  Labs:  Recent Labs  02/25/15 0808 02/25/15 1428  02/25/15 2059 02/26/15 0119 02/26/15 0319 02/26/15 0729 02/27/15 0356 02/27/15 1251  HGB 14.5  --   --   --  14.0  --   --  13.6  --   HCT 44.2  --   --   --  42.0  --   --  40.6  --   PLT 120*  --   --   --  119*  --   --  119*  --   APTT 31  --   --   --   --   --   --   --   --   LABPROT 15.6*  --   --   --  16.4*  --   --  17.0*  --   INR 1.22  --   --   --  1.30  --   --  1.36  --   HEPARINUNFRC  --   --   < >  --  0.49  --  0.64 0.71* 0.81*  CREATININE 1.01  --   --   --  1.10  --   --   --   --   TROPONINI 0.04* 0.04*  --  0.04*  --  0.04*  --   --   --   < > = values in this interval not displayed.  Estimated Creatinine Clearance: 73.6 mL/min (by C-G formula based on Cr of 1.1).   Medications:  Scheduled:  . aspirin EC  81 mg Oral Daily  . atorvastatin  40 mg Oral QPM  . cefUROXime  500 mg Oral BID WC  . furosemide  40 mg Oral BID  . isosorbide mononitrate  30 mg Oral Daily  . lisinopril  40 mg Oral Daily  . pantoprazole  40 mg Oral Daily  . pneumococcal 23 valent vaccine  0.5 mL Intramuscular Tomorrow-1000  . potassium chloride  20 mEq Oral Daily  . spironolactone  25 mg Oral Daily  . warfarin  10 mg Oral Daily  . Warfarin - Pharmacist Dosing Inpatient   Does not apply q1800   Infusions:  . heparin 1,500 Units/hr (02/27/15 1338)   PRN: acetaminophen **OR** acetaminophen, alum & mag hydroxide-simeth, morphine injection, nitroGLYCERIN, oxyCODONE, sodium chloride  Assessment: Patient on  heparin drip for PE.  Patient is also receiving warfarin therapy.   INR: 1.36  Goal of Therapy:  Heparin level 0.3-0.7 units/ml  Goal INR: 2-3 Monitor platelets by anticoagulation protocol: Yes   Plan:  Patient received warfarin 7.5 mg po daily x 2 doses.  MD increased dose to warfarin 10 mg po daily today.  Continue current dosing and recheck INR in AM.  Heparin level at 1251 of 0.81.  Will decreased heparin drip rate to 1500 units/hr and recheck Anti-Xa level in 6 hours at 2000.    Pharmacy will continue to follow.  Kushal Saunders G 02/27/2015,1:58 PM

## 2015-02-27 NOTE — Clinical Social Work Placement (Signed)
   CLINICAL SOCIAL WORK PLACEMENT  NOTE  Date:  02/27/2015  Patient Details  Name: Dillon [name] MRN: [id number] Date of Birth: [birth year]  Clinical Social Work is seeking post-discharge placement for this patient at the [place] level of care (*CSW will initial, date and re-position this form in  chart as items are completed):  Yes   Patient/family provided with [place] Work Department's list of facilities offering this level of care within the geographic area requested by the patient (or if unable, by the patient's family).  Yes   Patient/family informed of their freedom to choose among providers that offer the needed level of care, that participate in Medicare, Medicaid or managed care program needed by the patient, have an available bed and are willing to accept the patient.  Yes   Patient/family informed of [facility]'s ownership interest in [facility] and [facility], as well as of the fact that they are under no obligation to receive care at these facilities.  PASRR submitted to EDS on 02/26/15     PASRR number received on 02/26/15     Existing PASRR number confirmed on       FL2 transmitted to all facilities in geographic area requested by pt/family on 02/26/15     FL2 transmitted to all facilities within larger geographic area on 02/26/15     Patient informed that his/her managed care company has contracts with or will negotiate with certain facilities, including the following:        Yes   Patient/family informed of bed offers received.  Patient [facility] bed at [facility]     Physician recommends and patient chooses bed at      Patient to be transferred to [facility] on  .  Patient to be transferred to facility by       Patient family notified on   of transfer.  Name of family member notified:        PHYSICIAN Please sign FL2     Additional Comment:     _______________________________________________ Mathews Argyle, LCSW 02/27/2015, 10:09 AM

## 2015-02-27 NOTE — Progress Notes (Signed)
ANTICOAGULATION CONSULT NOTE - Follow Up Consult  Pharmacy Consult for Heparin/Warfarin Indication: pulmonary embolus  No Known Allergies  Patient Measurements: Height: 5\' 9"  (175.3 cm) Weight: 212 lb 13.3 oz (96.54 kg) IBW/kg (Calculated) : 70.7 Heparin Dosing Weight: 89 kg  Vital Signs: Temp: 98.7 F (37.1 C) (06/29 0525) Temp Source: Oral (06/29 0525) BP: 117/70 mmHg (06/29 0525) Pulse Rate: 49 (06/29 0525)  Labs:  Recent Labs  02/25/15 5825 02/25/15 1428  02/25/15 2059 02/26/15 0119 02/26/15 0319 02/26/15 0729 02/27/15 0356  HGB 14.5  --   --   --  14.0  --   --  13.6  HCT 44.2  --   --   --  42.0  --   --  40.6  PLT 120*  --   --   --  119*  --   --  119*  APTT 31  --   --   --   --   --   --   --   LABPROT 15.6*  --   --   --  16.4*  --   --  17.0*  INR 1.22  --   --   --  1.30  --   --  1.36  HEPARINUNFRC  --   --   < >  --  0.49  --  0.64 0.71*  CREATININE 1.01  --   --   --  1.10  --   --   --   TROPONINI 0.04* 0.04*  --  0.04*  --  0.04*  --   --   < > = values in this interval not displayed.  Estimated Creatinine Clearance: 73.6 mL/min (by C-G formula based on Cr of 1.1).   Medications:  Scheduled:  . aspirin EC  81 mg Oral Daily  . atorvastatin  40 mg Oral QPM  . cefUROXime  500 mg Oral BID WC  . furosemide  40 mg Oral BID  . isosorbide mononitrate  30 mg Oral Daily  . lisinopril  40 mg Oral Daily  . pantoprazole  40 mg Oral Daily  . pneumococcal 23 valent vaccine  0.5 mL Intramuscular Tomorrow-1000  . potassium chloride  20 mEq Oral Daily  . spironolactone  25 mg Oral Daily  . warfarin  10 mg Oral Daily  . Warfarin - Pharmacist Dosing Inpatient   Does not apply q1800   Infusions:  . heparin 1,700 Units/hr (02/27/15 0655)   PRN: acetaminophen **OR** acetaminophen, morphine injection, nitroGLYCERIN, oxyCODONE, sodium chloride  Assessment: Patient on heparin drip for PE.  Patient is also receiving warfarin therapy.   INR: 1.36  Goal of  Therapy:  Heparin level 0.3-0.7 units/ml  Goal INR: 2-3 Monitor platelets by anticoagulation protocol: Yes   Plan:  Patient received warfarin 7.5 mg po daily x 2 doses.  MD increased dose to warfarin 10 mg po daily today.  Continue current dosing and recheck INR in AM.  Heparin level at 0336 of 0.71.  Heparin drip rate reduced to 1700 units/hr.  Follow up anti-Xa level ordered for 1300.  Pharmacy will continue to follow.  Alexa Blish G 02/27/2015,9:23 AM

## 2015-02-27 NOTE — Progress Notes (Signed)
[facility] at [place] NAME: Dillon [name]    MR#:  [id number]  DATE OF BIRTH:  11-11-1945  SUBJECTIVE:  I am doing as good as I can  REVIEW OF SYSTEMS:   Review of Systems  Constitutional: Positive for malaise/fatigue. Negative for fever, chills and weight loss.  HENT: Negative for ear discharge, ear pain and nosebleeds.   Eyes: Negative for blurred vision, pain and discharge.  Respiratory: Positive for shortness of breath. Negative for sputum production, wheezing and stridor.   Cardiovascular: Negative for chest pain, palpitations, orthopnea and PND.  Gastrointestinal: Negative for nausea, vomiting, abdominal pain and diarrhea.  Genitourinary: Negative for urgency and frequency.  Musculoskeletal: Negative for back pain and joint pain.  Neurological: Positive for weakness. Negative for sensory change, speech change and focal weakness.  Psychiatric/Behavioral: Negative for depression. The patient is not nervous/anxious.   All other systems reviewed and are negative.  Tolerating Diet:yes Tolerating HU:DJSHF  DRUG ALLERGIES:  No Known Allergies  VITALS:  Blood pressure 133/70, pulse 57, temperature 97.7 F (36.5 C), temperature source Oral, resp. rate 19, height 5\' 9"  (1.753 m), weight 96.54 kg (212 lb 13.3 oz), SpO2 98 %.  PHYSICAL EXAMINATION:   Physical Exam  GENERAL:  69 y.o.-year-old patient lying in the bed with no acute distress.  EYES: Pupils equal, round, reactive to light and accommodation. No scleral icterus. Extraocular muscles intact.  HEENT: Head atraumatic, normocephalic. Oropharynx and nasopharynx clear.  NECK:  Supple, no jugular venous distention. No thyroid enlargement, no tenderness.  LUNGS: Normal breath sounds bilaterally, no wheezing, rales, rhonchi. No use of accessory muscles of respiration.  CARDIOVASCULAR: S1, S2 normal. No murmurs, rubs, or gallops.  ABDOMEN: Soft, nontender, nondistended. Bowel  sounds present. No organomegaly or mass.  EXTREMITIES: No cyanosis, clubbing  Bilateral 2+ edema. NEUROLOGIC: Cranial nerves II through XII are intact. No focal Motor or sensory deficits b/l.   PSYCHIATRIC:  alert and oriented x 3.  SKIN: No obvious rash, lesion, or ulcer.   LABORATORY PANEL:   CBC  Recent Labs Lab 02/27/15 0356  WBC 9.8  HGB 13.6  HCT 40.6  PLT 119*    Chemistries   Recent Labs Lab 02/26/15 0119  NA 141  K 3.8  CL 108  CO2 22  GLUCOSE 109*  BUN 23*  CREATININE 1.10  CALCIUM 9.7    Cardiac Enzymes  Recent Labs Lab 02/26/15 0319  TROPONINI 0.04*    RADIOLOGY:  Mr Jodene Nam Abdomen W Wo Contrast  02/26/2015   CLINICAL DATA:  69 year old male currently admitted with pulmonary emboli and lower extremity edema. Bilateral lower extremity duplex ultrasound was negative for residual DVT. Large left renal mass noted on CT PE study. Evaluate for left renal vein tumor thrombus as well as central DVT.  EXAM: MRA ABDOMEN WITH OR WITHOUT CONTRAST  TECHNIQUE: Angiographic images of the chest were obtained using MRA technique without and with intravenous contrast.  CONTRAST:  3mL MULTIHANCE GADOBENATE DIMEGLUMINE 529 MG/ML IV SOLN  COMPARISON:  Lower extremity duplex ultrasound 02/25/2015  FINDINGS: VASCULAR  All visualized segments of the inferior vena cava from the intrahepatic segment to the bifurcation are widely patent. There is no external compression or evidence of thrombus. There are 2 right and a solitary left renal vein. All renal veins are widely patent. No evidence of tumor extension from the left renal mass into the left renal vein. Of note, there is a hypertrophic left lumbar artery likely secondary  to the presence of the large left renal mass and associated increased venous return.  The aorta is normal in caliber without evidence of aneurysm or dissection. Unremarkable celiac artery, superior mesenteric artery and bilateral dominant renal arteries.  NON  VASCULAR  There is a large centrally necrotic lobular exophytic enhancing mass arising from the medial upper pole of the left kidney. The mass is somewhat difficult to measure given the irregular shape but measures approximately 8.6 x 6.9 x 7.2 cm. There is no evidence of invasion into the renal vein. Neovascularity is present surrounding the mass in the retroperitoneal space and there is hypertrophy of a left lumbar artery arising from the posterior aspect of the left renal vein. T2 hyperintense, T1 dark a nonenhancing simple cyst measuring 1.3 cm in the interpolar right kidney. No additional enhancing renal mass identified in either kidney. No evidence of hydronephrosis.  No definitive periaortic adenopathy or other evidence of distant metastatic disease. Of note, the examination is moderately degraded by motion.  IMPRESSION: VASCULAR  1. No evidence of caval or proximal iliac DVT. The more distal common and external iliac veins are not included in the field of view of an MRV of the abdomen. 2. No evidence of bland or tumor thrombus within the left renal vein. NON VASCULAR  1. Large, irregular and lobular 8.6 x 6.9 x 7.2 cm enhancing centrally necrotic mass exophytic from the medial upper pole of the left kidney. The imaging appearance is most consistent with a renal cell carcinoma. No evidence of renal vein invasion, suspicious adenopathy or distant metastatic disease within the imaged field of view. Recommend consultation with urology. 2. 1.3 cm simple cyst in the interpolar right kidney.  Signed,  Criselda Peaches, MD  [facility]  [facility]   Electronically Signed   By: Jacqulynn Cadet M.D.   On: 02/26/2015 08:04     ASSESSMENT AND PLAN:  69 y.o. male with a known history of recent stroke. Came in with swelling of his right lower extremity and trouble breathing since Thursday  1. Submassive pulmonary embolism. Risk is recent hospitalization and also  renal mass. Since the patient did have a recent stroke, not a candidate for TPA.  -IV heparin gtt with po coumadin -INR trending up slowly -sats >92% on RA  2. Elevated troponin likely from demand ischemia with pulmonary embolism.  3. Recent stroke with right-sided weakness- continue aspirin. Hold Plavix since the patient is on IV heparin.  4. Decreased pulses right lower extremity and cool extremity-  -seen by dr dew. W/u as out pt  5. Renal mass- appear renal cell carcinoma. Spoke with Dr Erlene Quan and given all his recent co-morbidities does not recommend any further w/u due his high risk for any aggressive procedure. Daughter agreeable with plan  6. hyperlipidemia continue atorvastatin  7. Edema lower extremities continue Lasix.  Pt will benefit from Rehab  And agreeable. Will d/c once INR therapuetic CSW/CM to work with d/c planing  Case discussed with Care Management/Social Worker. Management plans discussed with the patient, family and they are in agreement.  CODE STATUS: DNR  DVT Prophylaxis: Heparin gtt  TOTAL TIME TAKING CARE OF THIS PATIENT: 35 minutes.  >50% time spent on counselling and coordination of care  POSSIBLE D/C IN 2-3DAYS, DEPENDING ON CLINICAL CONDITION.   Rockne Dearinger M.D on 02/27/2015 at 11:47 AM  Between 7am to 6pm - Pager - [phone]  After 6pm go to [email or web address] - password [email or web address] [facility]  [facility]  Office  [phone]  CC: Primary care physician; No PCP Per Patient

## 2015-02-27 NOTE — Progress Notes (Signed)
Per MD Posey Pronto ok to order maalox for indigestion.  Also INR needs to be above 2 to DC

## 2015-02-27 NOTE — Progress Notes (Signed)
[facility]  Daily Progress Note   Subjective  - * No surgery found *  Feeling fine Sitting up eating lunch. Foot about the same Now with renal cell carcinoma  Objective Filed Vitals:   02/26/15 1531 02/26/15 2101 02/27/15 0525 02/27/15 1057  BP: 123/61 104/56 117/70 133/70  Pulse: 44 47 49 57  Temp: 98.2 F (36.8 C) 98 F (36.7 C) 98.7 F (37.1 C) 97.7 F (36.5 C)  TempSrc: Oral Oral Oral Oral  Resp: 20 18 18 19   Height:      Weight:      SpO2: 99% 98% 97% 98%    Intake/Output Summary (Last 24 hours) at 02/27/15 1247 Last data filed at 02/27/15 0929  Gross per 24 hour  Intake    486 ml  Output   1950 ml  Net  -1464 ml    PULM  CTAB CV  RRR VASC  Foot unchanged from previous.  Swelling still about 2+ on right, 1+ on left.    Laboratory CBC    Component Value Date/Time   WBC 9.8 02/27/2015 0356   WBC 7.9 04/12/2014 0452   HGB 13.6 02/27/2015 0356   HGB 13.0 04/12/2014 0452   HCT 40.6 02/27/2015 0356   HCT 40.0 04/12/2014 0452   PLT 119* 02/27/2015 0356   PLT 143* 04/12/2014 0452    BMET    Component Value Date/Time   NA 141 02/26/2015 0119   NA 141 04/12/2014 0452   K 3.8 02/26/2015 0119   K 3.9 04/12/2014 0452   CL 108 02/26/2015 0119   CL 102 04/12/2014 0452   CO2 22 02/26/2015 0119   CO2 29 04/12/2014 0452   GLUCOSE 109* 02/26/2015 0119   GLUCOSE 124* 04/12/2014 0452   BUN 23* 02/26/2015 0119   BUN 21* 04/12/2014 0452   CREATININE 1.10 02/26/2015 0119   CREATININE 1.16 04/12/2014 0452   CALCIUM 9.7 02/26/2015 0119   CALCIUM 8.6 04/12/2014 0452   GFRNONAA >60 02/26/2015 0119   GFRNONAA >60 04/12/2014 0452   GFRAA >60 02/26/2015 0119   GFRAA >60 04/12/2014 0452    Assessment/Planning: PE:  Doing well on heparin PAD:  Right foot stable.  No limb threatening symptoms.  This will clearly take back seat to renal cell ca issues and carotid disease Renal mass:  Likely renal cell carcinoma.  Urology to  see Symptomatic, ulcerated high grade left ICA stenosis.  Would probably benefit from stenting over surgery, but timing in question with urologic issues and PE      DEW,JASON  02/27/2015, 12:47 PM

## 2015-02-27 NOTE — Care Management (Signed)
Important Message  Patient Details  Name: BRANDOL CORP MRN: [id number] Date of Birth: 10/25/1945   Medicare Important Message Given:       [name] 02/27/2015, 12:42 PM

## 2015-02-27 NOTE — Progress Notes (Addendum)
ANTICOAGULATION CONSULT NOTE - Follow Up Consult  Pharmacy Consult for Heparin/Warfarin Indication: pulmonary embolus  No Known Allergies  Patient Measurements: Height: 5\' 9"  (175.3 cm) Weight: 212 lb 13.3 oz (96.54 kg) IBW/kg (Calculated) : 70.7 Heparin Dosing Weight: 89 kg  Vital Signs: Temp: 98.2 F (36.8 C) (06/29 2026) Temp Source: Oral (06/29 2026) BP: 113/51 mmHg (06/29 2026) Pulse Rate: 59 (06/29 2026)  Labs:  Recent Labs  02/25/15 1610 02/25/15 1428  02/25/15 2059 02/26/15 0119 02/26/15 0319  02/27/15 0356 02/27/15 1251 02/27/15 2008  HGB 14.5  --   --   --  14.0  --   --  13.6  --   --   HCT 44.2  --   --   --  42.0  --   --  40.6  --   --   PLT 120*  --   --   --  119*  --   --  119*  --   --   APTT 31  --   --   --   --   --   --   --   --   --   LABPROT 15.6*  --   --   --  16.4*  --   --  17.0*  --   --   INR 1.22  --   --   --  1.30  --   --  1.36  --   --   HEPARINUNFRC  --   --   < >  --  0.49  --   < > 0.71* 0.81* 0.47  CREATININE 1.01  --   --   --  1.10  --   --   --   --   --   TROPONINI 0.04* 0.04*  --  0.04*  --  0.04*  --   --   --   --   < > = values in this interval not displayed.  Estimated Creatinine Clearance: 73.6 mL/min (by C-G formula based on Cr of 1.1).   Medications:  Scheduled:  . aspirin EC  81 mg Oral Daily  . atorvastatin  40 mg Oral QPM  . cefUROXime  500 mg Oral BID WC  . furosemide  40 mg Oral BID  . isosorbide mononitrate  30 mg Oral Daily  . lisinopril  40 mg Oral Daily  . pantoprazole  40 mg Oral Daily  . pneumococcal 23 valent vaccine  0.5 mL Intramuscular Tomorrow-1000  . potassium chloride  20 mEq Oral Daily  . spironolactone  25 mg Oral Daily  . warfarin  10 mg Oral Daily  . Warfarin - Pharmacist Dosing Inpatient   Does not apply q1800   Infusions:  . heparin 1,500 Units/hr (02/27/15 1338)   PRN: acetaminophen **OR** acetaminophen, alum & mag hydroxide-simeth, morphine injection, nitroGLYCERIN, oxyCODONE,  sodium chloride  Assessment: Patient on heparin drip for PE.  Patient is also receiving warfarin therapy.   INR: 1.36  Goal of Therapy:  Heparin level 0.3-0.7 units/ml  Goal INR: 2-3 Monitor platelets by anticoagulation protocol: Yes   Plan:  Patient received warfarin 7.5 mg po daily x 2 doses.  MD increased dose to warfarin 10 mg po daily today.  Continue current dosing and recheck INR in AM.  Heparin level at 1251 of 0.81.  Will decreased heparin drip rate to 1500 units/hr and recheck Anti-Xa level in 6 hours at 2000.   6/29:  HL = 0.47 Will recheck HL in  6 hrs on 6/30 @ 2:00.   Pharmacy will continue to follow.  Robbins,Jason D 02/27/2015,9:18 PM     6/30 02:00 anti-Xa <0.1. >1.8 on rerun per lab. Will order STAT redraw to confirm anti-Xa.  6/30 04:00 anti-Xa 0.65. Continue current rate of 1500 units/hr and recheck with CBC tomorrow AM.   Sim Boast, PharmD, BCPS  02/28/2015

## 2015-02-28 LAB — CBC
HEMATOCRIT: 40 % (ref 40.0–52.0)
Hemoglobin: 13.6 g/dL (ref 13.0–18.0)
MCH: 29.1 pg (ref 26.0–34.0)
MCHC: 33.9 g/dL (ref 32.0–36.0)
MCV: 85.8 fL (ref 80.0–100.0)
PLATELETS: 121 10*3/uL — AB (ref 150–440)
RBC: 4.67 MIL/uL (ref 4.40–5.90)
RDW: 14.5 % (ref 11.5–14.5)
WBC: 7.6 10*3/uL (ref 3.8–10.6)

## 2015-02-28 LAB — HEPARIN LEVEL (UNFRACTIONATED)
Heparin Unfractionated: <0.1 IU/mL — ABNORMAL LOW (ref 0.30–0.70)
Heparin Unfractionated: 0.65 IU/mL (ref 0.30–0.70)

## 2015-02-28 LAB — PROTIME-INR
INR: 1.35
PROTHROMBIN TIME: 16.9 s — AB (ref 11.4–15.0)

## 2015-02-28 MED ORDER — CEFUROXIME AXETIL 500 MG PO TABS: 500.0000 mg | ORAL_TABLET | Freq: Two times a day (BID) | ORAL | Status: AC

## 2015-02-28 MED ORDER — APIXABAN 5 MG PO TABS: 10.0000 mg | ORAL_TABLET | Freq: Two times a day (BID) | ORAL | Status: AC

## 2015-02-28 MED ORDER — APIXABAN 5 MG PO TABS
10.0000 mg | ORAL_TABLET | Freq: Two times a day (BID) | ORAL | Status: DC
  Administered 2015-02-28: 10 mg via ORAL
  Filled 2015-02-28: qty 2

## 2015-02-28 MED ORDER — APIXABAN 5 MG PO TABS: 5.0000 mg | ORAL_TABLET | Freq: Two times a day (BID) | ORAL | Status: DC

## 2015-02-28 MED ORDER — APIXABAN 5 MG PO TABS: 5.0000 mg | ORAL_TABLET | Freq: Two times a day (BID) | ORAL | Status: AC

## 2015-02-28 NOTE — Progress Notes (Signed)
Discontinued heparin and then gave first dose of eliquis, per pharmacist. Patient educated on eliquis.

## 2015-02-28 NOTE — Care Management (Signed)
It appears that Eliquis that was left in patient's room for daughter to pick up was forgotten.  Have contacted daughter

## 2015-02-28 NOTE — Discharge Summary (Signed)
[place] at [place] NAME: Dillon [name]    MR#:  [id number]  DATE OF BIRTH:  28-Jan-1946  DATE OF ADMISSION:  02/25/2015 ADMITTING PHYSICIAN: Loletha Grayer, MD  DATE OF DISCHARGE: 02/28/2015  PRIMARY CARE PHYSICIAN: No PCP Per Patient pt will start [facility] in August   ADMISSION DIAGNOSIS:  Renal mass [N28.89] Pleural effusion on left [J94.8] Pulmonary emboli [I26.99]  DISCHARGE DIAGNOSIS:  Submassive PE now on po Eliquis Recent CVA Left large renal mass (not a candidate for further w/u)  SECONDARY DIAGNOSIS:   Past Medical History  Diagnosis Date  . Acute MI   . Coronary artery disease   . CHF (congestive heart failure)   . Hypertension   . Hyperlipidemia   . Stroke     HOSPITAL COURSE:   69 y.o. male with a known history of recent stroke. Came in with swelling of his right lower extremity and trouble breathing since Thursday  1. Submassive pulmonary embolism. Risk is recent hospitalization and also renal mass. Since the patient did have a recent stroke, not a candidate for TPA.  -IV heparin gtt received for first 48 hours. Will change to po eliquis at discharge -sats >92% on RA  2. Elevated troponin likely from demand ischemia with pulmonary embolism.  3. Recent stroke with right-sided weakness- continue aspirin. Hold Plavix since the patient is on po eliquis  4. Decreased pulses right lower extremity and cool extremity -seen by dr dew. W/u as out pt  5. Renal mass- appear renal cell carcinoma. Discussed with Dr Erlene Quan  and given all his recent co-morbidities does not recommend any further w/u due his high risk for any aggressive procedure. Daughter agreeable with plan  6. hyperlipidemia continue atorvastatin  7. Edema lower extremities continue Lasix.  Overall stable  [place] today for rehab per PT recommendations DISCHARGE CONDITIONS:   fair  CONSULTS OBTAINED:  Treatment Team:  Hollice Espy, MD  DRUG ALLERGIES:  No Known Allergies  DISCHARGE MEDICATIONS:   Current Discharge Medication List    START taking these medications   Details  !! apixaban (ELIQUIS) 5 MG TABS tablet Take 2 tablets (10 mg total) by mouth 2 (two) times daily. Qty: 60 tablet, Refills: 0    !! apixaban (ELIQUIS) 5 MG TABS tablet Take 1 tablet (5 mg total) by mouth 2 (two) times daily. Qty: 60 tablet, Refills: 0    cefUROXime (CEFTIN) 500 MG tablet Take 1 tablet (500 mg total) by mouth 2 (two) times daily with a meal. Qty: 5 tablet, Refills: 0     !! - Potential duplicate medications found. Please discuss with provider.    CONTINUE these medications which have NOT CHANGED   Details  aspirin EC 81 MG EC tablet Take 1 tablet (81 mg total) by mouth daily. Qty: 30 tablet, Refills: 1    atorvastatin (LIPITOR) 40 MG tablet Take 2 tablets (80 mg total) by mouth daily at 6 PM. Qty: 30 tablet, Refills: 1    furosemide (LASIX) 40 MG tablet Take 1 tablet (40 mg total) by mouth 2 (two) times daily. Qty: 30 tablet, Refills: 1    isosorbide mononitrate (IMDUR) 30 MG 24 hr tablet Take 1 tablet (30 mg total) by mouth daily. Qty: 30 tablet, Refills: 1    lisinopril (PRINIVIL,ZESTRIL) 40 MG tablet Take 40 mg by mouth daily.    metolazone (ZAROXOLYN) 2.5 MG tablet Take 2.5 mg by mouth daily.    metoprolol tartrate (LOPRESSOR)  25 MG tablet Take 25 mg by mouth 2 (two) times daily.    nitroGLYCERIN (NITROSTAT) 0.4 MG SL tablet Place 1 tablet (0.4 mg total) under the tongue every 5 (five) minutes as needed. For chest pain. May take up to 3 doses. Qty: 20 tablet, Refills: 5    pantoprazole (PROTONIX) 40 MG tablet Take 40 mg by mouth daily.    Potassium Chloride ER 20 MEQ TBCR Take 20 mEq by mouth daily. Qty: 30 tablet, Refills: 1    spironolactone (ALDACTONE) 25 MG tablet Take 1 tablet (25 mg total) by mouth daily. Qty: 30 tablet, Refills: 1    torsemide (DEMADEX) 20 MG tablet Take 20 mg by mouth 2  (two) times daily.      STOP taking these medications     clopidogrel (PLAVIX) 75 MG tablet         you experience worsening of your admission symptoms, develop shortness of breath, life threatening emergency, suicidal or homicidal thoughts you must seek medical attention immediately by calling 911 or calling your MD immediately  if symptoms less severe.  You Must read complete instructions/literature along with all the possible adverse reactions/side effects for all the Medicines you take and that have been prescribed to you. Take any new Medicines after you have completely understood and accept all the possible adverse reactions/side effects.   Please note  You were cared for by a hospitalist during your hospital stay. If you have any questions about your discharge medications or the care you received while you were in the hospital after you are discharged, you can call the unit and asked to speak with the hospitalist on call if the hospitalist that took care of you is not available. Once you are discharged, your primary care physician will handle any further medical issues. Please note that NO REFILLS for any discharge medications will be authorized once you are discharged, as it is imperative that you return to your primary care physician (or establish a relationship with a primary care physician if you do not have one) for your aftercare needs so that they can reassess your need for medications and monitor your lab values. Today   SUBJECTIVE   Doing well. Eating good  VITAL SIGNS:  Blood pressure 135/70, pulse 60, temperature 97.5 F (36.4 C), temperature source Axillary, resp. rate 18, height 5\' 9"  (1.753 m), weight 96.54 kg (212 lb 13.3 oz), SpO2 97 %.  I/O:   Intake/Output Summary (Last 24 hours) at 02/28/15 0911 Last data filed at 02/28/15 0820  Gross per 24 hour  Intake 1084.52 ml  Output   1600 ml  Net -515.48 ml    PHYSICAL EXAMINATION:  GENERAL: 69 y.o.-year-old  patient lying in the bed with no acute distress.  EYES: Pupils equal, round, reactive to light and accommodation. No scleral icterus. Extraocular muscles intact.  HEENT: Head atraumatic, normocephalic. Oropharynx and nasopharynx clear.  NECK: Supple, no jugular venous distention. No thyroid enlargement, no tenderness.  LUNGS: Normal breath sounds bilaterally, no wheezing, rales, rhonchi. No use of accessory muscles of respiration.  CARDIOVASCULAR: S1, S2 normal. No murmurs, rubs, or gallops.  ABDOMEN: Soft, nontender, nondistended. Bowel sounds present. No organomegaly or mass.  EXTREMITIES: No cyanosis, clubbing  Bilateral 2+ edema. NEUROLOGIC: Cranial nerves II through XII are intact. No focal Motor or sensory deficits b/l.  PSYCHIATRIC: alert and oriented x 3.  SKIN: No obvious rash, lesion, or ulcer.   DATA REVIEW:   CBC   Recent Labs Lab 02/28/15  0235  WBC 7.6  HGB 13.6  HCT 40.0  PLT 121*    Chemistries   Recent Labs Lab 02/26/15 0119  NA 141  K 3.8  CL 108  CO2 22  GLUCOSE 109*  BUN 23*  CREATININE 1.10  CALCIUM 9.7    Microbiology Results   Recent Results (from the past 240 hour(s))  Urine culture     Status: None   Collection Time: 02/25/15  1:20 PM  Result Value Ref Range Status   Specimen Description URINE, CLEAN CATCH  Final   Special Requests Normal  Final   Culture   Final    MULTIPLE SPECIES PRESENT, SUGGEST RECOLLECTION IF CLINICALLY INDICATED   Report Status 02/27/2015 FINAL  Final  Culture, blood (single)     Status: None   Collection Time: 01/23/19 11:49 PM  Result Value Ref Range Status   Micro Text Report   Final       COMMENT                   NO GROWTH AEROBICALLY/ANAEROBICALLY IN 5 DAYS   ANTIBIOTIC                                                       Management plans discussed with the patient, family and they are in agreement.  CODE STATUS:     Code Status Orders        Start     Ordered   02/25/15 1230   Do not attempt resuscitation (DNR)   Continuous    Question Answer Comment  In the event of cardiac or respiratory ARREST Do not call a "code blue"   In the event of cardiac or respiratory ARREST Do not perform Intubation, CPR, defibrillation or ACLS   In the event of cardiac or respiratory ARREST Use medication by any route, position, wound care, and other measures to relive pain and suffering. May use oxygen, suction and manual treatment of airway obstruction as needed for comfort.   Comments Nurse may pronounce      02/25/15 1230      TOTAL TIME TAKING CARE OF THIS PATIENT40inutes.    Twanna Resh M.D on 02/28/2015 at 9:11 AM  Between 7am to 6pm - Pager - [phone] After 6pm go to [email or web address] - password [email or web address] [facility]  [facility] [facility]  Office  [phone]  CC: Primary care physician; No PCP Per Patient

## 2015-02-28 NOTE — Progress Notes (Signed)
Per ME Dillon [name] pt's Eliquis order is correct as order for DC, and f/u appts are requested as necessary

## 2015-02-28 NOTE — Clinical Social Work Note (Signed)
CSW notified pt, pt's daughter Kenney Houseman [phone], facility and RN that pt would DC today via his daughter to [facility].  CSW signing off

## 2015-02-28 NOTE — Care Management (Signed)
Informed by attending that patient is for discharge to skilled nursing today and based on pharmacist recommendations, will discharge on Xarelto or Eliquis.  Will provide daughter with 30 day coupon for which one is ordered.  Daughter confirms that patient has medicare part D coverage.

## 2015-02-28 NOTE — Discharge Instructions (Signed)
Please take Eliquis 10 mg by mouth twice a day for 7 days. Start Eliquis 5 mg by mouth twice a day starting on 03/07/2015.

## 2015-02-28 NOTE — Care Management (Signed)
Important Message  Patient Details  Name: Dillon [name] MRN: [id number] Date of Birth: 06/13/1946   Medicare Important Message Given:  Yes-third notification given    [name] 02/28/2015, 10:28 AM

## 2015-02-28 NOTE — Progress Notes (Signed)
AVS given to patient and daughter for personal records. Discharge packet prepared by social work, sent with daughter and instructed to give to [facility]. DNR form in packet. IV's and tele discontinued. Eliquis education PE education given. Patient has no further questions. Report called to Leata Mouse at [facility].

## 2015-03-14 ENCOUNTER — Ambulatory Visit: Payer: Medicare Other | Admitting: Family

## 2015-10-30 DEATH — deceased

## 2016-09-12 IMAGING — MR MR HEAD W/O CM
9 of 10 series · 41 of 48 positions shown · non-contrast
Comparison: 02/12/2015 CT angiogram head and neck. 02/11/2015 head
CT. No comparison brain MR.

CLINICAL DATA: 68-year-old hypertensive male with history of
hyperlipidemia and acute infarct presenting with right-sided
weakness, speech issues and weakness in the legs with some falls
over the past 5 days. Subsequent encounter.

EXAM:
MRI HEAD WITHOUT CONTRAST
TECHNIQUE: Multiplanar, multiecho pulse sequences of the brain and surrounding
structures were obtained without intravenous contrast.

[Series 2: T1 · sagittal · 5.0mm · 0.45mm/px · 3 of 27 slices shown]
[im 1/27]
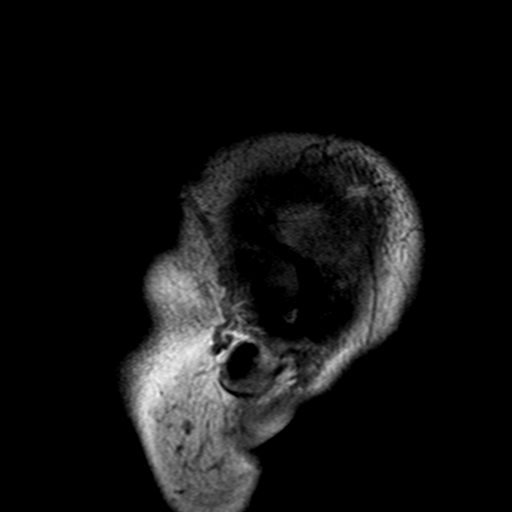
[im 14/27]
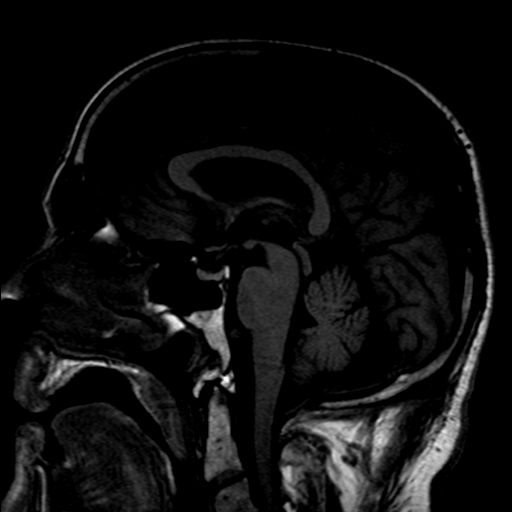
[im 27/27]
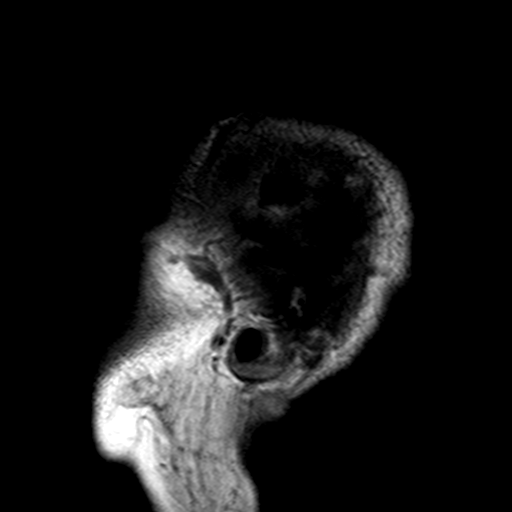

[Series 7: T2 · axial · 5.0mm · 0.45mm/px · z∈[-9,+154]mm · 4 of 27 slices shown (1 of 3)]
[im 1/27]
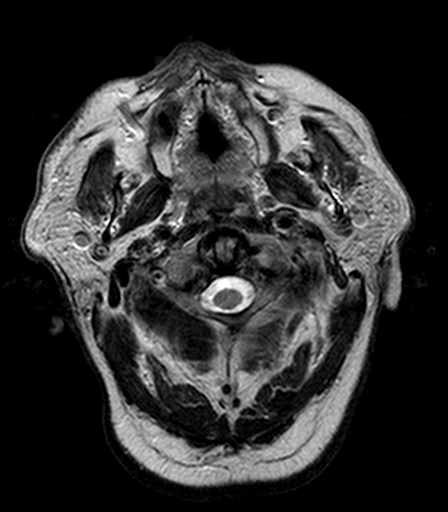
[im 9/27]
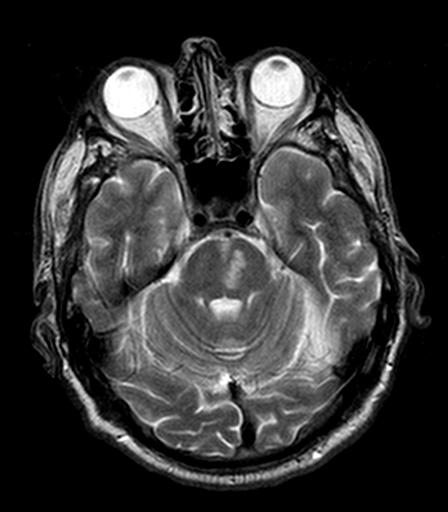
[im 18/27]
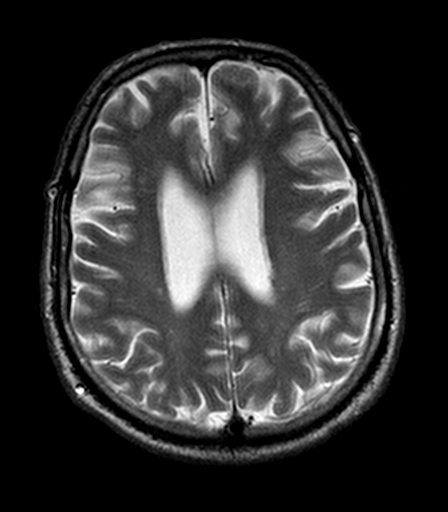
[im 27/27]
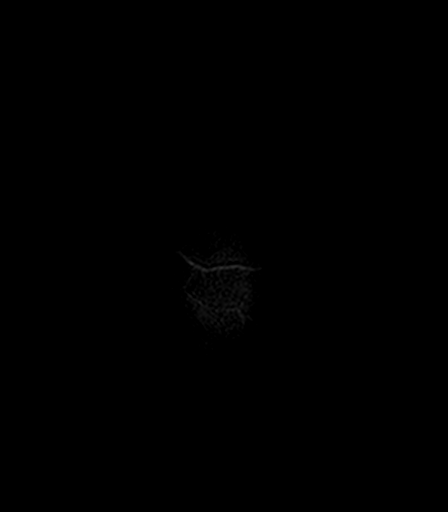

[Series 8: DWI · axial · 4.0mm · 0.94mm/px · z∈[-13,+146]mm · 6 of 42 slices shown (1 of 2)]
[im 1/42]
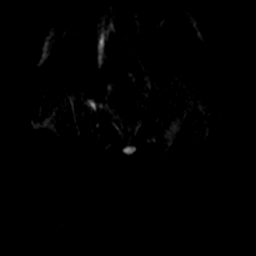
[im 9/42]
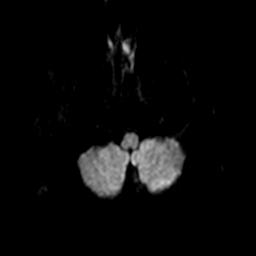
[im 17/42]
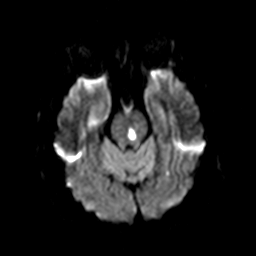
[im 25/42]
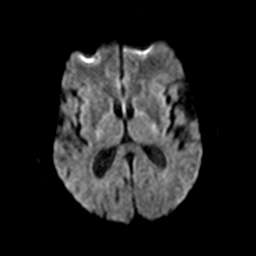
[im 33/42]
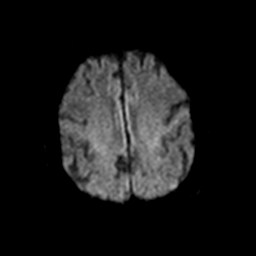
[im 42/42]
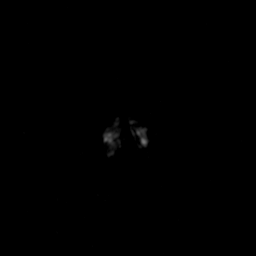

[Series 9: ADC · axial · 4.0mm · 0.94mm/px · z∈[-13,+157]mm · 6 of 45 slices shown (1 of 2)]
[im 1/45]
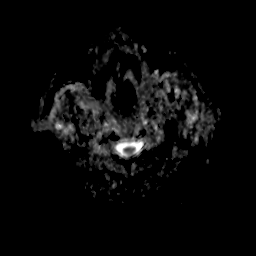
[im 9/45]
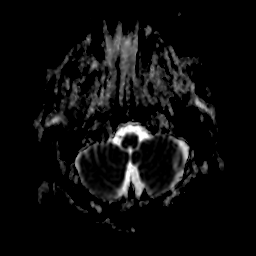
[im 18/45]
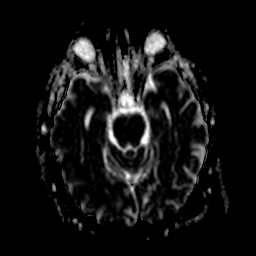
[im 27/45]
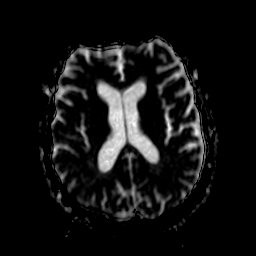
[im 36/45]
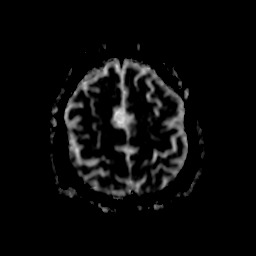
[im 45/45]
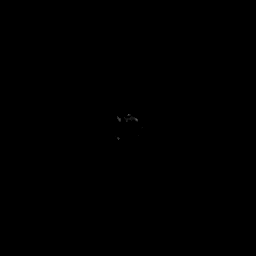

[Series 10: DWI · coronal · 5.0mm · 1.80mm/px · 5 of 38 slices shown (2 of 2)]
[im 1/38]
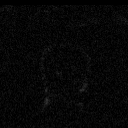
[im 10/38]
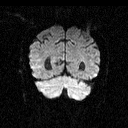
[im 19/38]
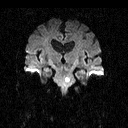
[im 28/38]
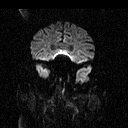
[im 38/38]
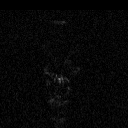

[Series 11: ADC · coronal · 5.0mm · 1.76mm/px · 5 of 41 slices shown (2 of 2)]
[im 1/41]
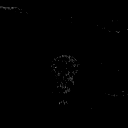
[im 11/41]
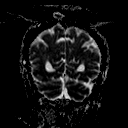
[im 21/41]
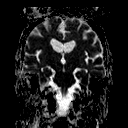
[im 31/41]
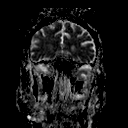
[im 41/41]
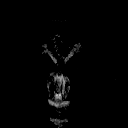

[Series 12: FLAIR · axial · 5.0mm · 0.90mm/px · z∈[-9,+154]mm · 4 of 27 slices shown]
[im 1/27]
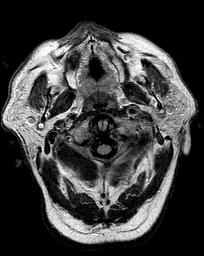
[im 9/27]
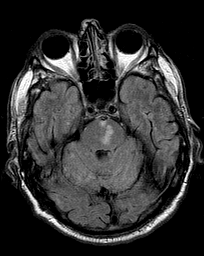
[im 18/27]
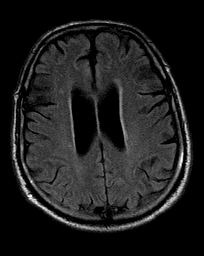
[im 27/27]
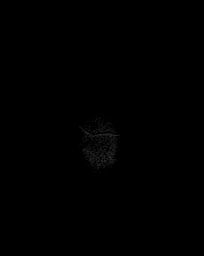

[Series 13: T2 · axial · 5.0mm · 0.45mm/px · z∈[-9,+154]mm · 4 of 27 slices shown (2 of 3)]
[im 1/27]
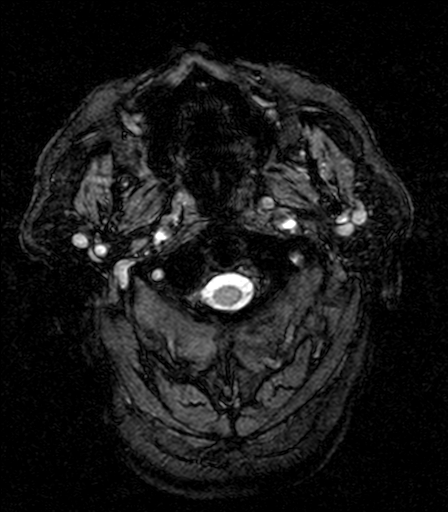
[im 9/27]
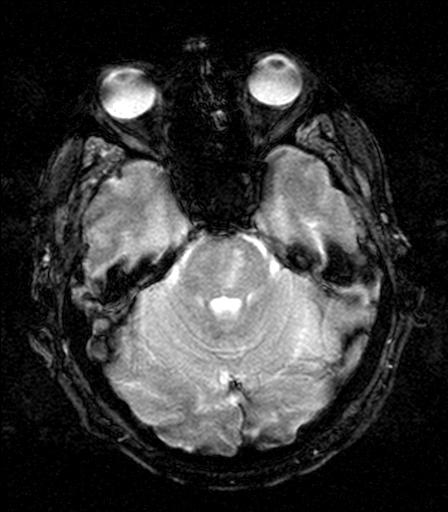
[im 18/27]
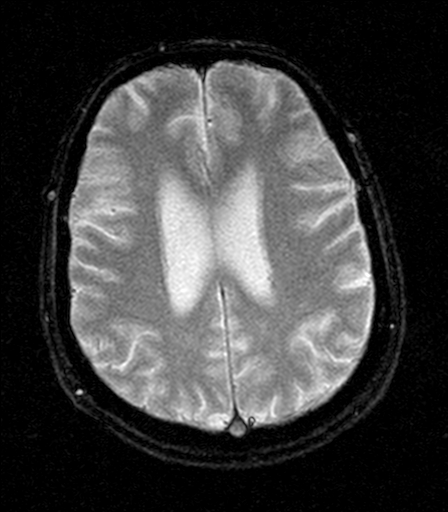
[im 27/27]
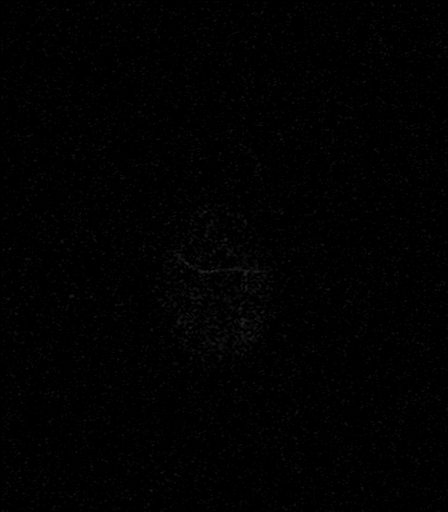

[Series 15: T2 · coronal · 5.0mm · 0.45mm/px · 4 of 31 slices shown (3 of 3)]
[im 1/31]
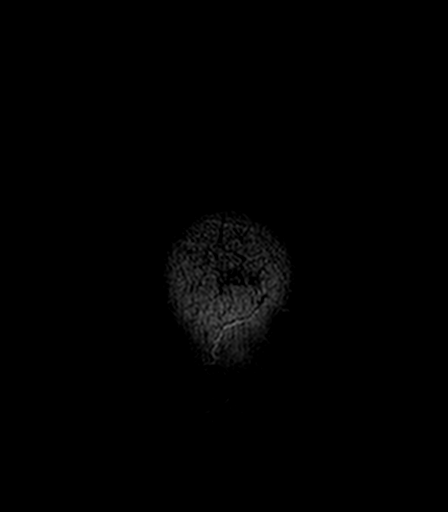
[im 11/31]
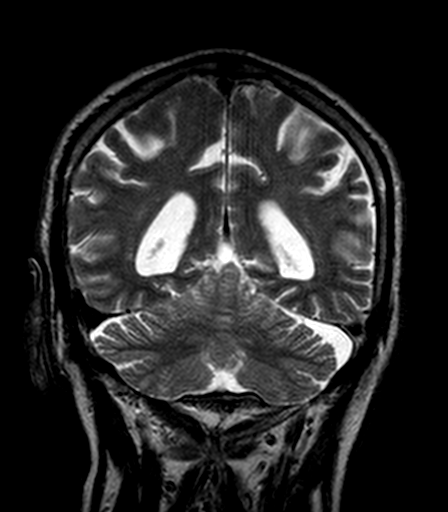
[im 21/31]
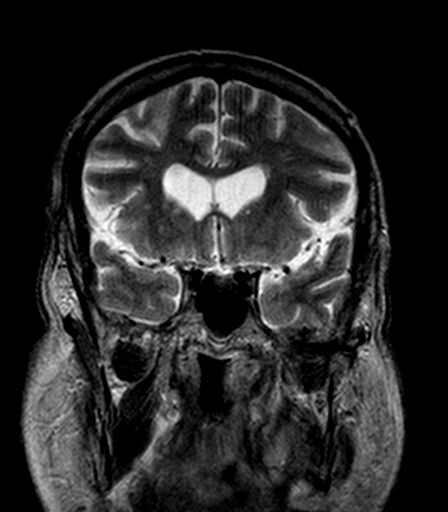
[im 31/31]
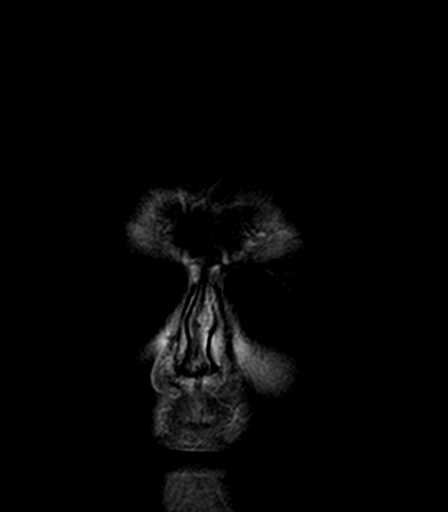

[41 of 48 positions shown; findings below may reference images not displayed]

FINDINGS: Acute nonhemorrhagic left paracentral pontine infarct.

Acute small infarct at the junction of the inferior aspect of the
left temporal-occipital lobe.

Mild small vessel disease type changes.

No intracranial hemorrhage.

Global atrophy without hydrocephalus.

No intracranial mass lesion noted on this unenhanced exam.

Left vertebral artery is small appearing to end in a posterior
inferior cerebellar artery distribution with superimposed
atherosclerotic type changes. Narrowing of the distal right
vertebral artery and basilar artery. Internal carotid arteries are
patent.

Mild spinal stenosis C3-4. Cervical medullary junction, pituitary
region, pineal region and orbital structures unremarkable.
IMPRESSION: Acute nonhemorrhagic left paracentral pontine infarct.

Acute small infarct at the junction of the inferior aspect of the
left temporal-occipital lobe.

Mild small vessel disease type changes.

No intracranial hemorrhage.

Global atrophy without hydrocephalus.

No intracranial mass lesion noted on this unenhanced exam.

Left vertebral artery is small appearing to end in a posterior
inferior cerebellar artery distribution with superimposed
atherosclerotic type changes. Narrowing of the distal right
vertebral artery and basilar artery.
